# Patient Record
Sex: Female | Born: 1945 | Race: White | Hispanic: No | Marital: Married | Smoking: Never smoker
Health system: Southern US, Community
[De-identification: ages and names within clinical notes are randomized; demographics above are authoritative.]

## PROBLEM LIST (undated history)

## (undated) ENCOUNTER — Emergency Department (HOSPITAL_BASED_OUTPATIENT_CLINIC_OR_DEPARTMENT_OTHER): Admission: EM | Payer: Medicare HMO

## (undated) DIAGNOSIS — I10 Essential (primary) hypertension: Secondary | ICD-10-CM

## (undated) DIAGNOSIS — N318 Other neuromuscular dysfunction of bladder: Secondary | ICD-10-CM

## (undated) DIAGNOSIS — R05 Cough: Secondary | ICD-10-CM

## (undated) DIAGNOSIS — R7302 Impaired glucose tolerance (oral): Secondary | ICD-10-CM

## (undated) DIAGNOSIS — J309 Allergic rhinitis, unspecified: Secondary | ICD-10-CM

## (undated) HISTORY — DX: Cough: R05

## (undated) HISTORY — DX: Other neuromuscular dysfunction of bladder: N31.8

## (undated) HISTORY — DX: Impaired glucose tolerance (oral): R73.02

## (undated) HISTORY — PX: OTHER SURGICAL HISTORY: SHX169

## (undated) HISTORY — DX: Allergic rhinitis, unspecified: J30.9

## (undated) HISTORY — DX: Essential (primary) hypertension: I10

---

## 1999-12-20 ENCOUNTER — Encounter (INDEPENDENT_AMBULATORY_CARE_PROVIDER_SITE_OTHER): Payer: Self-pay

## 1999-12-20 ENCOUNTER — Other Ambulatory Visit: Admission: RE | Admit: 1999-12-20 | Discharge: 1999-12-20 | Payer: Self-pay | Admitting: Obstetrics and Gynecology

## 2000-01-02 ENCOUNTER — Ambulatory Visit (HOSPITAL_COMMUNITY): Admission: RE | Admit: 2000-01-02 | Discharge: 2000-01-02 | Payer: Self-pay | Admitting: Obstetrics and Gynecology

## 2000-01-02 ENCOUNTER — Encounter: Payer: Self-pay | Admitting: Obstetrics and Gynecology

## 2000-08-01 ENCOUNTER — Encounter (INDEPENDENT_AMBULATORY_CARE_PROVIDER_SITE_OTHER): Payer: Self-pay | Admitting: *Deleted

## 2000-08-01 ENCOUNTER — Ambulatory Visit (HOSPITAL_BASED_OUTPATIENT_CLINIC_OR_DEPARTMENT_OTHER): Admission: RE | Admit: 2000-08-01 | Discharge: 2000-08-01 | Payer: Self-pay | Admitting: *Deleted

## 2001-01-26 ENCOUNTER — Other Ambulatory Visit: Admission: RE | Admit: 2001-01-26 | Discharge: 2001-01-26 | Payer: Self-pay | Admitting: Obstetrics and Gynecology

## 2001-02-02 ENCOUNTER — Encounter: Payer: Self-pay | Admitting: Obstetrics and Gynecology

## 2001-02-02 ENCOUNTER — Ambulatory Visit (HOSPITAL_COMMUNITY): Admission: RE | Admit: 2001-02-02 | Discharge: 2001-02-02 | Payer: Self-pay | Admitting: Obstetrics and Gynecology

## 2001-03-09 ENCOUNTER — Ambulatory Visit (HOSPITAL_COMMUNITY): Admission: RE | Admit: 2001-03-09 | Discharge: 2001-03-09 | Payer: Self-pay | Admitting: Cardiology

## 2001-04-10 ENCOUNTER — Other Ambulatory Visit: Admission: RE | Admit: 2001-04-10 | Discharge: 2001-04-10 | Payer: Self-pay | Admitting: Obstetrics and Gynecology

## 2001-04-10 ENCOUNTER — Encounter (INDEPENDENT_AMBULATORY_CARE_PROVIDER_SITE_OTHER): Payer: Self-pay

## 2002-02-01 ENCOUNTER — Other Ambulatory Visit: Admission: RE | Admit: 2002-02-01 | Discharge: 2002-02-01 | Payer: Self-pay | Admitting: Obstetrics and Gynecology

## 2002-02-15 ENCOUNTER — Ambulatory Visit (HOSPITAL_COMMUNITY): Admission: RE | Admit: 2002-02-15 | Discharge: 2002-02-15 | Payer: Self-pay | Admitting: Obstetrics and Gynecology

## 2002-02-15 ENCOUNTER — Encounter: Payer: Self-pay | Admitting: Obstetrics and Gynecology

## 2003-01-31 ENCOUNTER — Other Ambulatory Visit: Admission: RE | Admit: 2003-01-31 | Discharge: 2003-01-31 | Payer: Self-pay | Admitting: Obstetrics and Gynecology

## 2003-02-21 ENCOUNTER — Ambulatory Visit (HOSPITAL_COMMUNITY): Admission: RE | Admit: 2003-02-21 | Discharge: 2003-02-21 | Payer: Self-pay | Admitting: Obstetrics and Gynecology

## 2003-02-21 ENCOUNTER — Encounter: Admission: RE | Admit: 2003-02-21 | Discharge: 2003-02-21 | Payer: Self-pay | Admitting: Obstetrics and Gynecology

## 2003-02-21 ENCOUNTER — Encounter: Payer: Self-pay | Admitting: Obstetrics and Gynecology

## 2003-06-14 ENCOUNTER — Ambulatory Visit (HOSPITAL_COMMUNITY): Admission: RE | Admit: 2003-06-14 | Discharge: 2003-06-14 | Payer: Self-pay | Admitting: Gastroenterology

## 2004-02-13 ENCOUNTER — Other Ambulatory Visit: Admission: RE | Admit: 2004-02-13 | Discharge: 2004-02-13 | Payer: Self-pay | Admitting: Obstetrics and Gynecology

## 2005-03-18 ENCOUNTER — Other Ambulatory Visit: Admission: RE | Admit: 2005-03-18 | Discharge: 2005-03-18 | Payer: Self-pay | Admitting: Obstetrics and Gynecology

## 2005-04-01 ENCOUNTER — Ambulatory Visit (HOSPITAL_COMMUNITY): Admission: RE | Admit: 2005-04-01 | Discharge: 2005-04-01 | Payer: Self-pay | Admitting: Obstetrics and Gynecology

## 2006-07-14 ENCOUNTER — Other Ambulatory Visit: Admission: RE | Admit: 2006-07-14 | Discharge: 2006-07-14 | Payer: Self-pay | Admitting: Obstetrics & Gynecology

## 2006-08-18 ENCOUNTER — Encounter: Admission: RE | Admit: 2006-08-18 | Discharge: 2006-08-18 | Payer: Self-pay | Admitting: Obstetrics and Gynecology

## 2007-09-11 ENCOUNTER — Ambulatory Visit (HOSPITAL_COMMUNITY): Admission: RE | Admit: 2007-09-11 | Discharge: 2007-09-11 | Payer: Self-pay | Admitting: Obstetrics & Gynecology

## 2007-09-17 ENCOUNTER — Other Ambulatory Visit: Admission: RE | Admit: 2007-09-17 | Discharge: 2007-09-17 | Payer: Self-pay | Admitting: Obstetrics and Gynecology

## 2008-10-11 ENCOUNTER — Ambulatory Visit (HOSPITAL_COMMUNITY): Admission: RE | Admit: 2008-10-11 | Discharge: 2008-10-11 | Payer: Self-pay | Admitting: Obstetrics and Gynecology

## 2009-03-28 ENCOUNTER — Ambulatory Visit: Payer: Self-pay | Admitting: Internal Medicine

## 2009-03-28 DIAGNOSIS — I10 Essential (primary) hypertension: Secondary | ICD-10-CM

## 2009-03-28 DIAGNOSIS — N318 Other neuromuscular dysfunction of bladder: Secondary | ICD-10-CM

## 2009-03-28 DIAGNOSIS — R05 Cough: Secondary | ICD-10-CM | POA: Insufficient documentation

## 2009-03-28 DIAGNOSIS — J309 Allergic rhinitis, unspecified: Secondary | ICD-10-CM

## 2009-03-28 DIAGNOSIS — R059 Cough, unspecified: Secondary | ICD-10-CM

## 2009-03-28 HISTORY — DX: Other neuromuscular dysfunction of bladder: N31.8

## 2009-03-28 HISTORY — DX: Essential (primary) hypertension: I10

## 2009-03-28 HISTORY — DX: Allergic rhinitis, unspecified: J30.9

## 2009-03-28 HISTORY — DX: Cough, unspecified: R05.9

## 2009-03-29 LAB — CONVERTED CEMR LAB: Vit D, 25-Hydroxy: 35 ng/mL (ref 30–89)

## 2009-11-14 ENCOUNTER — Ambulatory Visit (HOSPITAL_COMMUNITY): Admission: RE | Admit: 2009-11-14 | Discharge: 2009-11-14 | Payer: Self-pay | Admitting: Obstetrics & Gynecology

## 2009-12-04 ENCOUNTER — Ambulatory Visit: Payer: Self-pay | Admitting: Internal Medicine

## 2009-12-04 LAB — CONVERTED CEMR LAB
Albumin: 3.9 g/dL (ref 3.5–5.2)
Basophils Absolute: 0 10*3/uL (ref 0.0–0.1)
CO2: 29 meq/L (ref 19–32)
Calcium: 9.1 mg/dL (ref 8.4–10.5)
Chloride: 107 meq/L (ref 96–112)
Cholesterol: 199 mg/dL (ref 0–200)
Creatinine, Ser: 0.6 mg/dL (ref 0.4–1.2)
Eosinophils Absolute: 0.2 10*3/uL (ref 0.0–0.7)
HCT: 44.7 % (ref 36.0–46.0)
HDL: 52.8 mg/dL (ref >39.00)
Hemoglobin: 14.9 g/dL (ref 12.0–15.0)
LDL Cholesterol: 122 mg/dL — ABNORMAL HIGH (ref 0–99)
Leukocytes, UA: NEGATIVE
Lymphs Abs: 0.9 10*3/uL (ref 0.7–4.0)
MCHC: 33.4 g/dL (ref 30.0–36.0)
Neutro Abs: 2.8 10*3/uL (ref 1.4–7.7)
Nitrite: NEGATIVE
Platelets: 219 10*3/uL (ref 150.0–400.0)
RDW: 11.1 % — ABNORMAL LOW (ref 11.5–14.6)
Sodium: 141 meq/L (ref 135–145)
Specific Gravity, Urine: 1.025 (ref 1.000–1.030)
TSH: 1.96 microintl units/mL (ref 0.35–5.50)
Total Bilirubin: 1 mg/dL (ref 0.3–1.2)
Triglycerides: 121 mg/dL (ref 0.0–149.0)
Urobilinogen, UA: 0.2 (ref 0.0–1.0)

## 2009-12-08 ENCOUNTER — Ambulatory Visit: Payer: Self-pay | Admitting: Internal Medicine

## 2010-02-23 ENCOUNTER — Ambulatory Visit: Payer: Self-pay | Admitting: Internal Medicine

## 2010-05-30 IMAGING — CR DG CHEST 2V
2 series · 2 of 2 positions shown · non-contrast
Comparison: None

CLINICAL DATA: Cough.

CHEST - 2 VIEW

[view not recorded (1 of 2)]
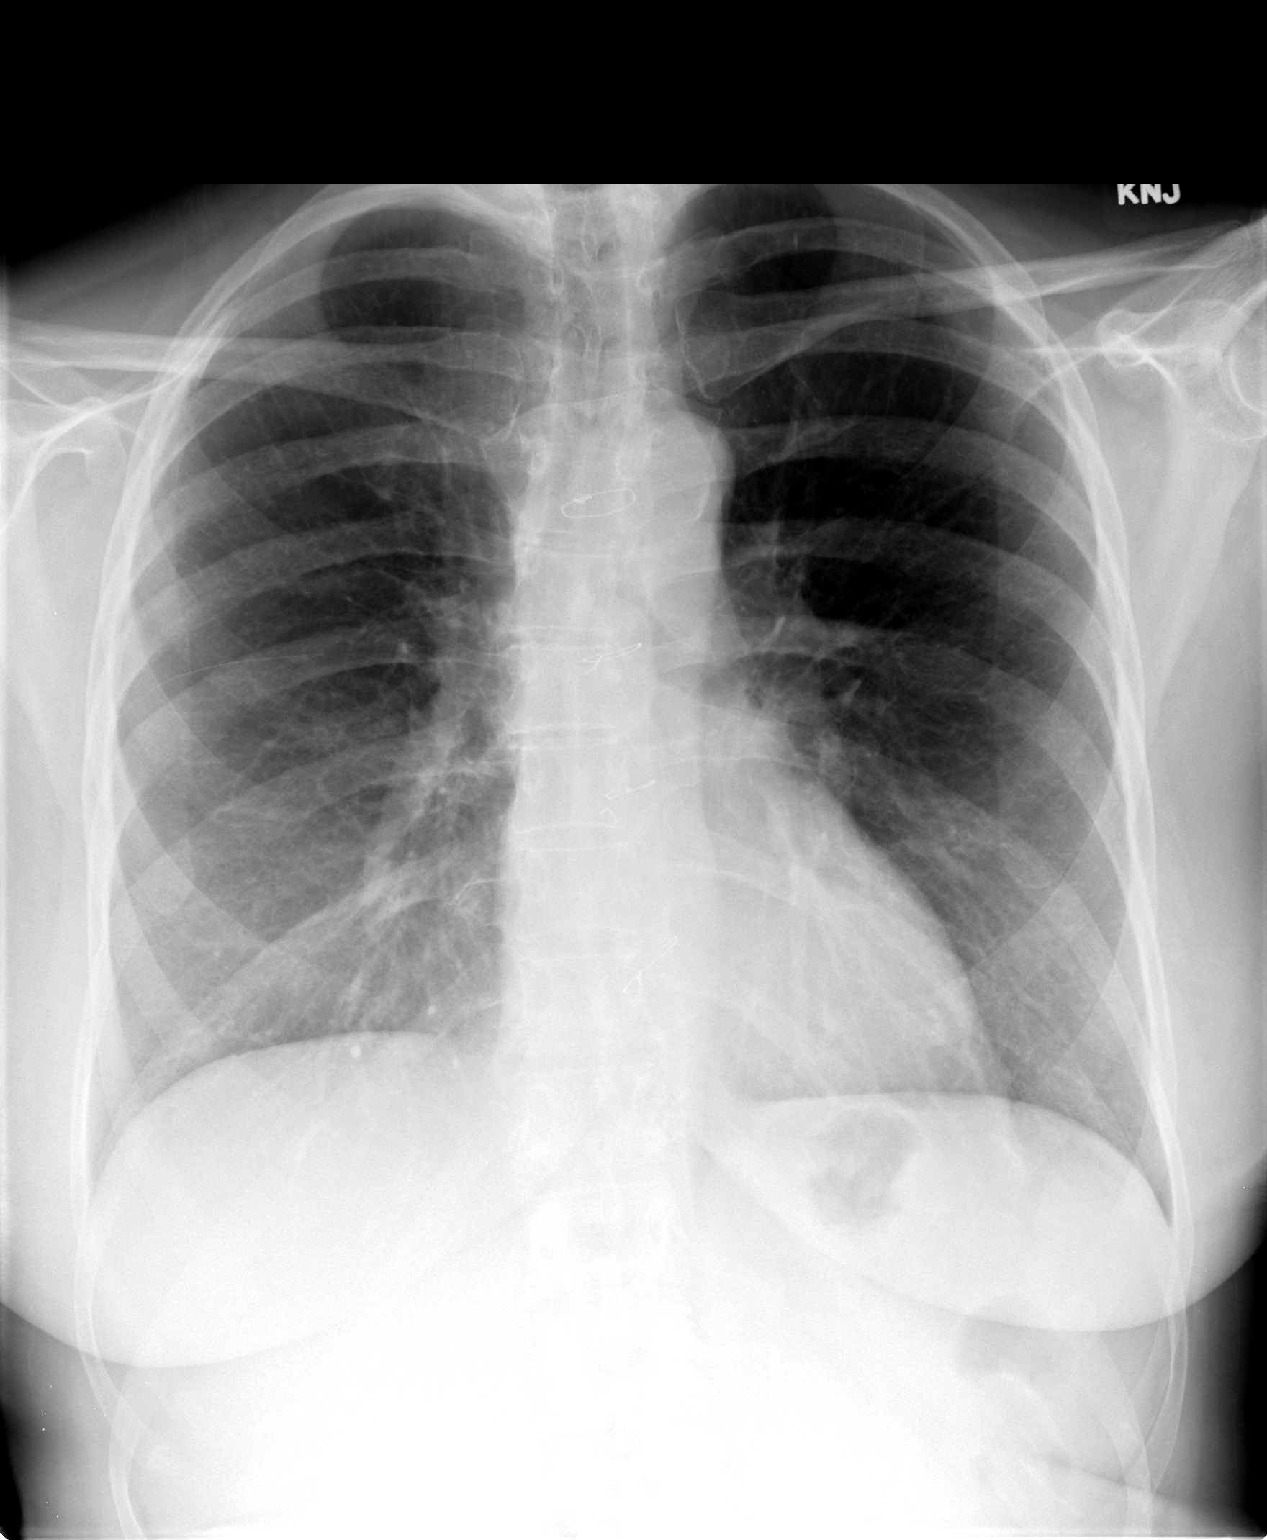

[view not recorded (2 of 2)]
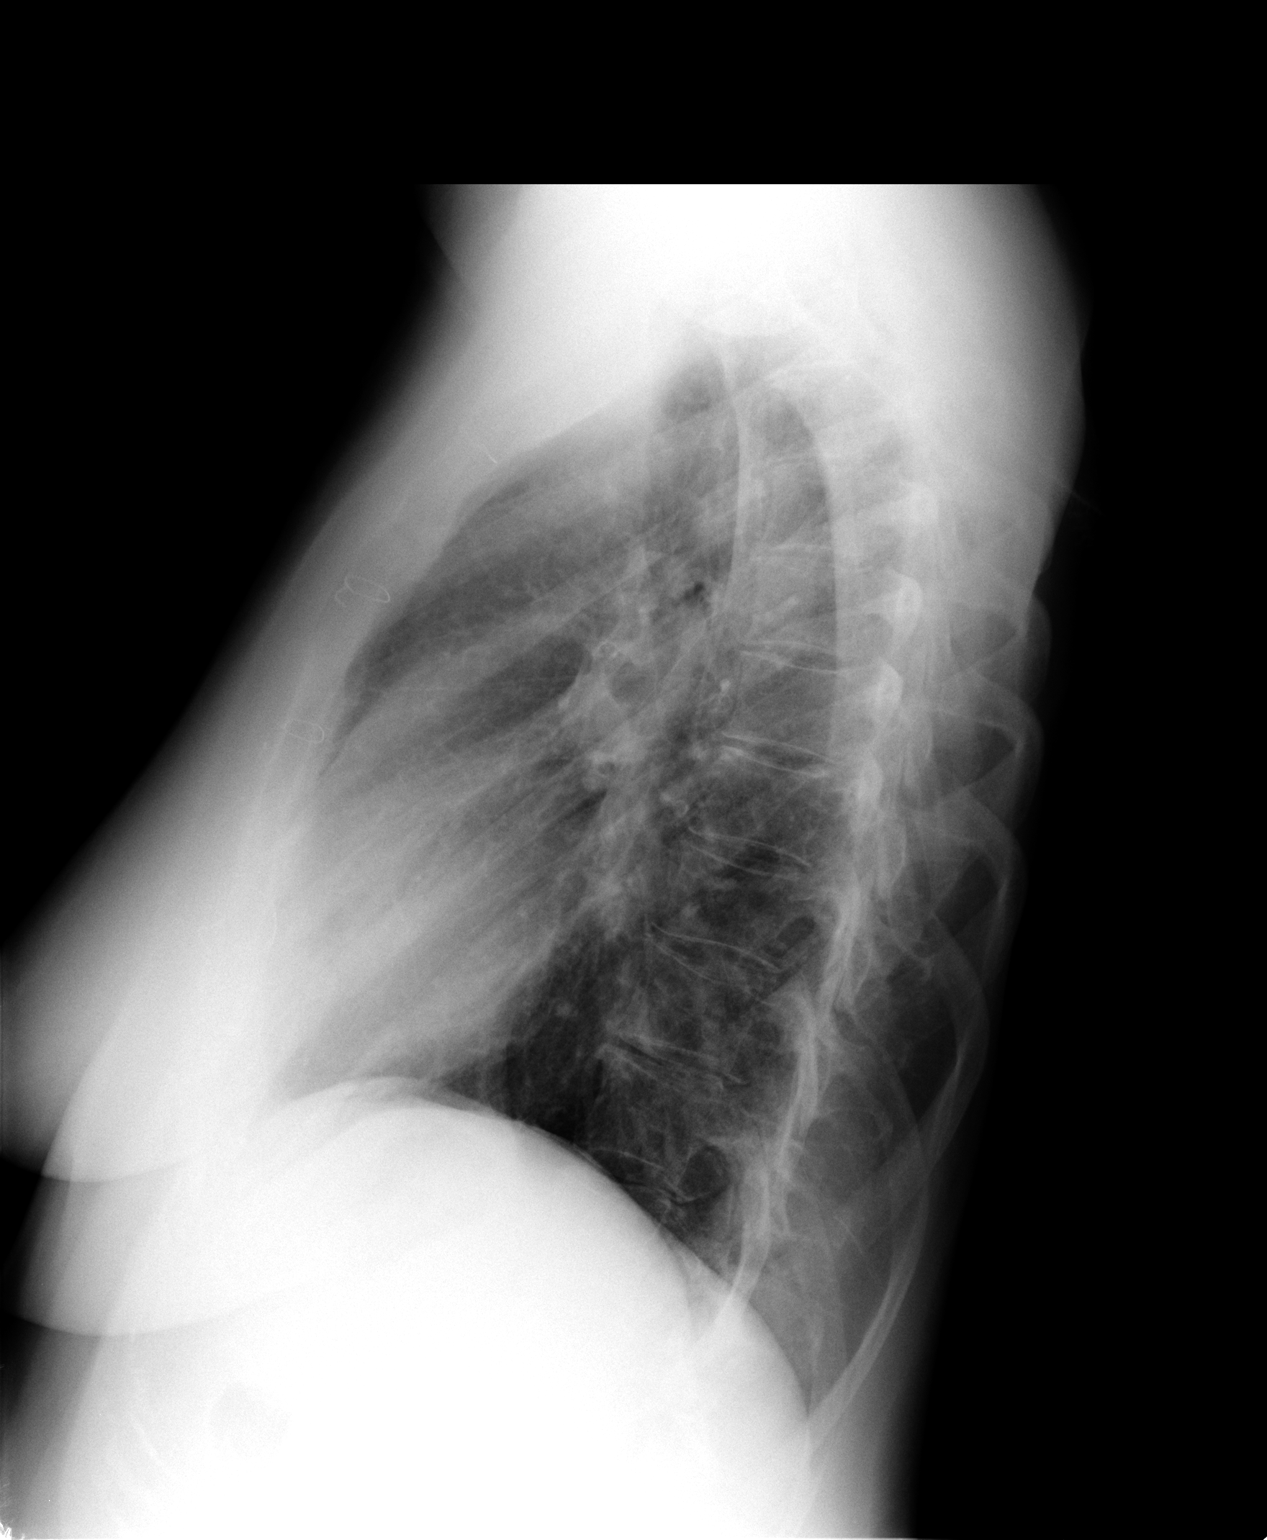

[2 of 2 positions shown; findings below may reference images not displayed]

FINDINGS: The heart size and mediastinal contours are within
normal limits.  Both lungs are clear.  The visualized skeletal
structures are unremarkable.Postop median sternotomy.
IMPRESSION: No active cardiopulmonary disease.

## 2010-11-25 LAB — CONVERTED CEMR LAB
Albumin: 4.1 g/dL (ref 3.5–5.2)
Alkaline Phosphatase: 72 units/L (ref 39–117)
Basophils Absolute: 0 10*3/uL (ref 0.0–0.1)
Basophils Relative: 1.3 % (ref 0.0–3.0)
Bilirubin Urine: NEGATIVE
CO2: 33 meq/L — ABNORMAL HIGH (ref 19–32)
Calcium: 9.1 mg/dL (ref 8.4–10.5)
Chloride: 106 meq/L (ref 96–112)
Cholesterol: 201 mg/dL — ABNORMAL HIGH (ref 0–200)
Direct LDL: 130.1 mg/dL
Eosinophils Absolute: 0.1 10*3/uL (ref 0.0–0.7)
Glucose, Bld: 105 mg/dL — ABNORMAL HIGH (ref 70–99)
HDL: 46.1 mg/dL (ref >39.00)
Hemoglobin: 15.4 g/dL — ABNORMAL HIGH (ref 12.0–15.0)
Ketones, ur: NEGATIVE mg/dL
Lymphocytes Relative: 19 % (ref 12.0–46.0)
Lymphs Abs: 0.7 10*3/uL (ref 0.7–4.0)
MCHC: 34.8 g/dL (ref 30.0–36.0)
MCV: 92.3 fL (ref 78.0–100.0)
Monocytes Absolute: 0.4 10*3/uL (ref 0.1–1.0)
Neutro Abs: 2.6 10*3/uL (ref 1.4–7.7)
Nitrite: NEGATIVE
RBC: 4.8 M/uL (ref 3.87–5.11)
RDW: 12 % (ref 11.5–14.6)
Sodium: 142 meq/L (ref 135–145)
Total Protein, Urine: NEGATIVE mg/dL
Total Protein: 6.8 g/dL (ref 6.0–8.3)
Triglycerides: 135 mg/dL (ref 0.0–149.0)
Urine Glucose: NEGATIVE mg/dL
VLDL: 27 mg/dL (ref 0.0–40.0)
pH: 6 (ref 5.0–8.0)

## 2010-11-27 NOTE — Assessment & Plan Note (Signed)
Summary: CPX-LB   Vital Signs:  Patient profile:   65 year old female Height:      68 inches Weight:      174.75 pounds BMI:     26.67 O2 Sat:      97 % on Room air Temp:     98.1 degrees F oral Pulse rate:   60 / minute BP sitting:   114 / 72  (left arm) Cuff size:   regular  Vitals Entered ByZella Ball Ewing (December 08, 2009 1:27 PM)  O2 Flow:  Room air  Preventive Care Screening  Mammogram:    Date:  10/28/2009    Results:  normal      s/p flu shot oct 2010 at [facility]  CC: Adult Physical/RE   CC:  Adult Physical/RE.  History of Present Illness: here with perennial nasal /sinus symtpoms  for most of the last 9 months since got the new dog;  OTC meds no help such a claritin, but husband nasal spray seemed to help; Pt denies CP, sob, doe, wheezing, orthopnea, pnd, worsening LE edema, palps, dizziness or syncope  Pt denies new neuro symptoms such as headache, facial or extremity weakness   Problems Prior to Update: 1)  Overactive Bladder  (ICD-596.51) 2)  Allergic Rhinitis  (ICD-477.9) 3)  Cough  (ICD-786.2) 4)  Preventive Health Care  (ICD-V70.0) 5)  Hypertension  (ICD-401.9)  Medications Prior to Update: 1)  Benazepril-Hydrochlorothiazide 20-12.5 Mg Tabs (Benazepril-Hydrochlorothiazide) .Marland Kitchen.. 1 By Mouth Once Daily 2)  Asprin 81mg  .... 1 By Mouth Once Daily  Current Medications (verified): 1)  Benazepril-Hydrochlorothiazide 20-12.5 Mg Tabs (Benazepril-Hydrochlorothiazide) .Marland Kitchen.. 1 By Mouth Once Daily 2)  Asprin 81mg  .... 1 By Mouth Once Daily 3)  Nasacort Aq 55 Mcg/act Aers (Triamcinolone Acetonide(Nasal)) .... 2 Spray/side Once Daily  Allergies (verified): No Known Drug Allergies  Past History:  Past Medical History: Last updated: 03/28/2009 Hypertension Allergic rhinitis  Past Surgical History: Last updated: 03/28/2009 s/p breast biopsy x 2 1970's - benign  Family History: Last updated: 03/28/2009 mother with RA, HTN  father with heart disease, CABg,  defibrillator, DM  Social History: Last updated: 03/28/2009 Married for second time 2 adult biological children work - part time- care giver for disabed Never Smoked Alcohol use-yes - social  Risk Factors: Smoking Status: never (03/28/2009)  Review of Systems  The patient denies anorexia, fever, weight loss, weight gain, vision loss, decreased hearing, hoarseness, chest pain, syncope, dyspnea on exertion, peripheral edema, prolonged cough, headaches, hemoptysis, abdominal pain, melena, hematochezia, severe indigestion/heartburn, hematuria, incontinence, muscle weakness, suspicious skin lesions, transient blindness, difficulty walking, depression, unusual weight change, abnormal bleeding, enlarged lymph nodes, and angioedema.         all otherwise negative per pt -  Physical Exam  General:  alert and well-developed.   Head:  normocephalic and atraumatic.   Eyes:  vision grossly intact, pupils equal, and pupils round.   Ears:  R ear normal and L ear normal.   Nose:  nasal dischargemucosal pallor and mucosal erythema.   Mouth:  no gingival abnormalities and pharynx pink and moist.   Neck:  supple and no masses.   Lungs:  normal respiratory effort and normal breath sounds.   Heart:  normal rate and regular rhythm.   Abdomen:  soft, non-tender, and normal bowel sounds.   Msk:  no joint tenderness and no joint swelling.   Extremities:  no edema, no erythema  Neurologic:  cranial nerves II-XII intact and strength normal in all  extremities.     Impression & Recommendations:  Problem # 1:  Preventive Health Care (ICD-V70.0)  Overall doing well, age appropriate education and counseling updated and referral for appropriate preventive services done unless declined, immunizations up to date or declined, diet counseling done if overweight, urged to quit smoking if smokes , most recent labs reviewed and current ordered if appropriate, ecg reviewed or declined (interpretation per ECG scanned  in the EMR if done); information regarding Medicare Prevention requirements given if appropriate   Orders: EKG w/ Interpretation (93000)  Problem # 2:  ALLERGIC RHINITIS (ICD-477.9)  Her updated medication list for this problem includes:    Nasacort Aq 55 Mcg/act Aers (Triamcinolone acetonide(nasal)) .Marland Kitchen... 2 spray/side once daily treat as above, f/u any worsening signs or symptoms   Complete Medication List: 1)  Benazepril-hydrochlorothiazide 20-12.5 Mg Tabs (Benazepril-hydrochlorothiazide) .Marland Kitchen.. 1 by mouth once daily 2)  Asprin 81mg   .... 1 by mouth once daily 3)  Nasacort Aq 55 Mcg/act Aers (Triamcinolone acetonide(nasal)) .... 2 spray/side once daily  Patient Instructions: 1)  please call for your yearly pap smear 2)  please call your insurance to ask if the shingles shot is covered 3)  if it is covered, call for a Nurse visit for the shot 4)  Please take all new medications as prescribed 5)  Continue all previous medications as before this visit  6)  Please schedule a follow-up appointment in 1 year or sooner if needed Prescriptions: BENAZEPRIL-HYDROCHLOROTHIAZIDE 20-12.5 MG TABS (BENAZEPRIL-HYDROCHLOROTHIAZIDE) 1 by mouth once daily  #90 x 3   Entered and Authorized by:   Corwin Levins MD   Signed by:   Corwin Levins MD on 12/08/2009   Method used:   Print then Give to Patient   RxID:   [id number] NASACORT AQ 55 MCG/ACT AERS (TRIAMCINOLONE ACETONIDE(NASAL)) 2 spray/side once daily  #3 x 3   Entered and Authorized by:   Corwin Levins MD   Signed by:   Corwin Levins MD on 12/08/2009   Method used:   Print then Give to Patient   RxID:   [id number]    Immunization History:  Influenza Immunization History:    Influenza:  historical (07/28/2009)    Immunization History:  Influenza Immunization History:    Influenza:  Historical (07/28/2009)

## 2010-11-27 NOTE — Assessment & Plan Note (Signed)
Summary: ZOSTAVAX--JWJ--PER PT [employer] PAY 100%-SAYS SHE CHECK'D W/HER INS...  Nurse Visit   Allergies: No Known Drug Allergies  Immunizations Administered:  Zostavax # 1:    Vaccine Type: Zostavax    Site: left deltoid    Mfr: Merck    Dose: 0.5 ml    Route: [place]    Given by: Margaret Pyle, CMA    Exp. Date: 03/04/2011    Lot #: [id number]  Orders Added: 1)  Zoster (Shingles) Vaccine Live [90736] 2)  Admin 1st Vaccine [13244]

## 2010-11-27 NOTE — Letter (Signed)
Summary: Wellness Screening Form/[facility]  Wellness Screening Form/[facility]   Imported By: Sherian Rein 02/27/2010 13:58:45  _____________________________________________________________________  External Attachment:    Type:   Image     Comment:   External Document

## 2010-12-06 ENCOUNTER — Other Ambulatory Visit: Payer: Self-pay

## 2010-12-06 DIAGNOSIS — Z1231 Encounter for screening mammogram for malignant neoplasm of breast: Secondary | ICD-10-CM

## 2010-12-18 ENCOUNTER — Ambulatory Visit (HOSPITAL_COMMUNITY)
Admission: RE | Admit: 2010-12-18 | Discharge: 2010-12-18 | Disposition: A | Discharge status: Home / Self Care | Payer: PRIVATE HEALTH INSURANCE | Source: Ambulatory Visit | Attending: Internal Medicine | Admitting: Internal Medicine

## 2010-12-18 DIAGNOSIS — Z1231 Encounter for screening mammogram for malignant neoplasm of breast: Secondary | ICD-10-CM | POA: Insufficient documentation

## 2011-02-26 ENCOUNTER — Other Ambulatory Visit: Payer: Self-pay | Admitting: Internal Medicine

## 2011-03-04 ENCOUNTER — Other Ambulatory Visit: Payer: Self-pay

## 2011-03-04 MED ORDER — BENAZEPRIL-HYDROCHLOROTHIAZIDE 20-12.5 MG PO TABS: 1.0000 | ORAL_TABLET | Freq: Every day | ORAL | Status: DC

## 2011-03-04 NOTE — Telephone Encounter (Signed)
Pt has CPX scheduled 04/12/2011. Pt requested we change Rx to 90 day supply because it is more expensive for 30 day.

## 2011-03-15 NOTE — Op Note (Signed)
   NAME:  Annette Schmitt, Annette Schmitt                         ACCOUNT NO.:  [id number]   MEDICAL RECORD NO.:  [id number]                   PATIENT TYPE:  AMB   LOCATION:  ENDO                                 FACILITY:  [facility]   PHYSICIAN:  Graylin Shiver, M.D.                DATE OF BIRTH:  [birth year]   DATE OF PROCEDURE:  06/14/2003  DATE OF DISCHARGE:                                 OPERATIVE REPORT   PROCEDURE:  Colonoscopy.   INDICATION FOR PROCEDURE:  Screening.   Informed consent was obtained after explanation of the risks of bleeding,  infection, and perforation.   PREMEDICATION:  Fentanyl 60 mcg IV, Versed 6 mg IV.   DESCRIPTION OF PROCEDURE:  With the patient in the left lateral decubitus  position, a rectal exam was performed and no masses were felt.  The Olympus  colonoscope was inserted into the rectum and advanced around the colon to  the cecum.  Cecal landmarks were identified.  The cecum and ascending colon  were normal.  The transverse colon was normal.  The descending colon,  sigmoid, and rectum were normal.  She tolerated the procedure well without  complications.   IMPRESSION:  Normal colonoscopy to the cecum.                                               Graylin Shiver, M.D.    Annette Schmitt  D:  06/14/2003  T:  06/14/2003  Job:  [id number]   cc:   Vikki Ports, M.D.  [address]  [place]  [place] [address]  Fax: [phone]

## 2011-03-15 NOTE — Op Note (Signed)
. [facility]  Patient:    Annette Schmitt, Annette Schmitt                      MRN: [id number] Proc. Date: 08/01/00 Adm. Date:  [id number] Attending:  Stephenie Acres                           Operative Report  PREOPERATIVE DIAGNOSIS:  Right shoulder mass.  POSTOPERATIVE DIAGNOSIS:  Right shoulder mass.  OPERATION PERFORMED:  Excision of right shoulder soft tissue mass.  SURGEON:  Stephenie Acres, M.D.  ANESTHESIA:  Local MAC.  DESCRIPTION OF PROCEDURE:  The patient was taken to the operating room and placed in supine position.  After adequate MAC anesthesia was induced,  The right shoulder was prepped and draped in normal sterile fashion.  Using 1% lidocaine local anesthesia, the skin and subcutaneous tissue were anesthetized.  A vertically oriented incision was made over the mass and dissected down through subcutaneous tissues onto a soft, fatty mass.  This was enucleated easily and delivered through the wound.  Adequate hemostasis was ensured and the skin was closed with subcuticular 4-0 Monocryl.  Steri-Strips and sterile dressing was applied.  The patient tolerated the procedure well and went to PACU in good condition. DD:  08/01/00 TD:  08/02/00 Job: [id number] LKG/[id number]

## 2011-04-05 ENCOUNTER — Other Ambulatory Visit (INDEPENDENT_AMBULATORY_CARE_PROVIDER_SITE_OTHER): Payer: PRIVATE HEALTH INSURANCE

## 2011-04-05 ENCOUNTER — Other Ambulatory Visit (INDEPENDENT_AMBULATORY_CARE_PROVIDER_SITE_OTHER): Payer: PRIVATE HEALTH INSURANCE | Admitting: Internal Medicine

## 2011-04-05 ENCOUNTER — Other Ambulatory Visit: Payer: Self-pay | Admitting: Internal Medicine

## 2011-04-05 DIAGNOSIS — Z0000 Encounter for general adult medical examination without abnormal findings: Secondary | ICD-10-CM

## 2011-04-05 LAB — CBC WITH DIFFERENTIAL/PLATELET
Basophils Absolute: 0.1 10*3/uL (ref 0.0–0.1)
Basophils Relative: 1.2 % (ref 0.0–3.0)
Eosinophils Absolute: 0.2 10*3/uL (ref 0.0–0.7)
Lymphocytes Relative: 21.5 % (ref 12.0–46.0)
MCHC: 34.6 g/dL (ref 30.0–36.0)
Neutrophils Relative %: 63.2 % (ref 43.0–77.0)
RBC: 4.9 Mil/uL (ref 3.87–5.11)
RDW: 12 % (ref 11.5–14.6)

## 2011-04-05 LAB — HEPATIC FUNCTION PANEL
Albumin: 4.1 g/dL (ref 3.5–5.2)
Alkaline Phosphatase: 67 U/L (ref 39–117)
Total Protein: 6.8 g/dL (ref 6.0–8.3)

## 2011-04-05 LAB — URINALYSIS, ROUTINE W REFLEX MICROSCOPIC
Nitrite: NEGATIVE
Specific Gravity, Urine: 1.02 (ref 1.000–1.030)
Urobilinogen, UA: 0.2 (ref 0.0–1.0)

## 2011-04-05 LAB — LIPID PANEL
Cholesterol: 210 mg/dL — ABNORMAL HIGH (ref 0–200)
HDL: 44.6 mg/dL (ref >39.00)
Triglycerides: 155 mg/dL — ABNORMAL HIGH (ref 0.0–149.0)
VLDL: 31 mg/dL (ref 0.0–40.0)

## 2011-04-05 LAB — BASIC METABOLIC PANEL
BUN: 20 mg/dL (ref 6–23)
GFR: 113.08 mL/min (ref >60.00)
Potassium: 4.2 mEq/L (ref 3.5–5.1)
Sodium: 141 mEq/L (ref 135–145)

## 2011-04-07 ENCOUNTER — Encounter: Payer: Self-pay | Admitting: Internal Medicine

## 2011-04-07 DIAGNOSIS — Z0000 Encounter for general adult medical examination without abnormal findings: Secondary | ICD-10-CM | POA: Insufficient documentation

## 2011-04-12 ENCOUNTER — Ambulatory Visit (INDEPENDENT_AMBULATORY_CARE_PROVIDER_SITE_OTHER)
Admission: RE | Admit: 2011-04-12 | Discharge: 2011-04-12 | Disposition: A | Discharge status: Home / Self Care | Payer: Medicare HMO | Source: Ambulatory Visit | Attending: Internal Medicine | Admitting: Internal Medicine

## 2011-04-12 ENCOUNTER — Encounter: Payer: Self-pay | Admitting: Internal Medicine

## 2011-04-12 ENCOUNTER — Ambulatory Visit (INDEPENDENT_AMBULATORY_CARE_PROVIDER_SITE_OTHER): Payer: Medicare HMO | Admitting: Internal Medicine

## 2011-04-12 VITALS — BP 118/70 | HR 73 | Temp 97.7°F | Ht 68.0 in | Wt 179.5 lb

## 2011-04-12 DIAGNOSIS — I10 Essential (primary) hypertension: Secondary | ICD-10-CM

## 2011-04-12 DIAGNOSIS — Z0000 Encounter for general adult medical examination without abnormal findings: Secondary | ICD-10-CM

## 2011-04-12 DIAGNOSIS — R059 Cough, unspecified: Secondary | ICD-10-CM

## 2011-04-12 DIAGNOSIS — R05 Cough: Secondary | ICD-10-CM

## 2011-04-12 DIAGNOSIS — R21 Rash and other nonspecific skin eruption: Secondary | ICD-10-CM

## 2011-04-12 DIAGNOSIS — J309 Allergic rhinitis, unspecified: Secondary | ICD-10-CM

## 2011-04-12 MED ORDER — LOSARTAN POTASSIUM-HCTZ 50-12.5 MG PO TABS: 1.0000 | ORAL_TABLET | Freq: Every day | ORAL | Status: DC

## 2011-04-12 MED ORDER — PREDNISONE 10 MG PO TABS
10.0000 mg | ORAL_TABLET | Freq: Every day | ORAL | Status: AC
Start: 2011-04-12 — End: 2011-04-22

## 2011-04-12 NOTE — Assessment & Plan Note (Signed)
prob multifactorial, for cxr , but suspect ace related +/- allergies ; to d/c the ace

## 2011-04-12 NOTE — Assessment & Plan Note (Signed)

## 2011-04-12 NOTE — Assessment & Plan Note (Signed)
stable overall by hx and exam, most recent data reviewed with pt, and pt to continue medical treatment as before  but to change to hyzaar to avoid cough;  F/u BP at home and next visit

## 2011-04-12 NOTE — Assessment & Plan Note (Signed)
For trial prednisone for flare symptoms  - to allergist if not improved

## 2011-04-12 NOTE — Patient Instructions (Addendum)
Take all new medications as prescribed - the new blood pressure medicine, and the short course prednisone Ok to stop the benazepril-HCT due to the cough Please go to XRAY in the Basement for the x-ray test Please call the phone number [phone] (the [employer] System) for results of testing in 2-3 days;  When calling, simply dial the number, and when prompted enter the MRN number above (the Medical Record Number) and the # key, then the message should start. Please call if you would like referral to allergist You will be contacted regarding the referral for: Dermatology Please return in 1 year for your yearly visit, or sooner if needed, with Lab testing done 3-5 days before Please remember to check your BP on a regular basis;  Your goal is to be < 140/90

## 2011-04-12 NOTE — Assessment & Plan Note (Signed)
With several white ? Comedone like lesion to nasal area - for derm referral

## 2011-04-21 ENCOUNTER — Encounter: Payer: Self-pay | Admitting: Internal Medicine

## 2011-04-21 NOTE — Progress Notes (Signed)
Subjective:    Patient ID: Annette Schmitt, female    DOB: [birth year], 65 y.o.   MRN: [id number]  HPI  Here for wellness and f/u;  Overall doing ok;  Pt denies CP, worsening SOB, DOE, wheezing, orthopnea, PND, worsening LE edema, palpitations, dizziness or syncope.  Pt denies neurological change such as new Headache, facial or extremity weakness.  Pt denies polydipsia, polyuria, or low sugar symptoms. Pt states overall good compliance with treatment and medications, good tolerability, and trying to follow lower cholesterol diet.  Pt denies worsening depressive symptoms, suicidal ideation or panic. No fever, wt loss, night sweats, loss of appetite, or other constitutional symptoms.  Pt states good ability with ADL's, low fall risk, home safety reviewed and adequate, no significant changes in hearing or vision, and occasionally active with exercise.  Unfortunately has had nonprod cough ongoing mild since about the time of starting the ACEI,  But also with Does have several wks ongoing nasal allergy symptoms with clear congestion, itch and sneeze, without fever, pain, ST,  or wheezing. Has rash as well to face and reqeusts derm referral Past Medical History  Diagnosis Date  . ALLERGIC RHINITIS 03/28/2009  . Cough 03/28/2009  . HYPERTENSION 03/28/2009  . OVERACTIVE BLADDER 03/28/2009   Past Surgical History  Procedure Date  . S/p breast biopsy      x2 1970's benign    reports that she has never smoked. She does not have any smokeless tobacco history on file. She reports that she drinks alcohol. She reports that she does not use illicit drugs. family history includes Arthritis in her mother; Diabetes in her father; Heart disease in her father; and Hypertension in her mother. No Known Allergies Current Outpatient Prescriptions on File Prior to Visit  Medication Sig Dispense Refill  . aspirin 81 MG EC tablet Take 81 mg by mouth daily.         Review of Systems Review of Systems  Constitutional: Negative  for diaphoresis, activity change, appetite change and unexpected weight change.  HENT: Negative for hearing loss, ear pain, facial swelling, mouth sores and neck stiffness.   Eyes: Negative for pain, redness and visual disturbance.  Respiratory: Negative for shortness of breath and wheezing.   Cardiovascular: Negative for chest pain and palpitations.  Gastrointestinal: Negative for diarrhea, blood in stool, abdominal distention and rectal pain.  Genitourinary: Negative for hematuria, flank pain and decreased urine volume.  Musculoskeletal: Negative for myalgias and joint swelling.  Skin: Negative for color change and wound.  Neurological: Negative for syncope and numbness.  Hematological: Negative for adenopathy.  Psychiatric/Behavioral: Negative for hallucinations, self-injury, decreased concentration and agitation.      Objective:   Physical Exam BP 118/70  Pulse 73  Temp(Src) 97.7 F (36.5 C) (Oral)  Ht 5\' 8"  (1.727 m)  Wt 179 lb 8 oz (81.421 kg)  BMI 27.29 kg/m2  SpO2 95% Physical Exam  VS noted Constitutional: Pt is oriented to person, place, and time. Appears well-developed and well-nourished.  HENT:  Head: Normocephalic and atraumatic.  Right Ear: External ear normal.  Left Ear: External ear normal.  Nose: Nose normal.  Mouth/Throat: Oropharynx is clear and moist.  Eyes: Conjunctivae and EOM are normal. Pupils are equal, round, and reactive to light.  Neck: Normal range of motion. Neck supple. No JVD present. No tracheal deviation present.  Cardiovascular: Normal rate, regular rhythm, normal heart sounds and intact distal pulses.   Pulmonary/Chest: Effort normal and breath sounds normal.  Abdominal: Soft. Bowel  sounds are normal. There is no tenderness.  Musculoskeletal: Normal range of motion. Exhibits no edema.  Lymphadenopathy:  Has no cervical adenopathy.  Neurological: Pt is alert and oriented to person, place, and time. Pt has normal reflexes. No cranial nerve  deficit.  Skin: Skin is warm and dry. No rash noted. except several white raised ? Comedones near nasal area Psychiatric:  Has  normal mood and affect. Behavior is normal.         Assessment & Plan:

## 2012-01-22 ENCOUNTER — Telehealth: Payer: Self-pay

## 2012-01-22 DIAGNOSIS — Z0000 Encounter for general adult medical examination without abnormal findings: Secondary | ICD-10-CM

## 2012-01-22 NOTE — Telephone Encounter (Signed)
Put order in for physical labs. 

## 2012-03-30 ENCOUNTER — Other Ambulatory Visit: Payer: Self-pay | Admitting: Internal Medicine

## 2012-04-10 ENCOUNTER — Other Ambulatory Visit (INDEPENDENT_AMBULATORY_CARE_PROVIDER_SITE_OTHER): Payer: Medicare HMO

## 2012-04-10 DIAGNOSIS — Z0000 Encounter for general adult medical examination without abnormal findings: Secondary | ICD-10-CM

## 2012-04-10 DIAGNOSIS — I10 Essential (primary) hypertension: Secondary | ICD-10-CM

## 2012-04-10 LAB — CBC WITH DIFFERENTIAL/PLATELET
Basophils Relative: 0.9 % (ref 0.0–3.0)
Eosinophils Absolute: 0.2 10*3/uL (ref 0.0–0.7)
Lymphocytes Relative: 19.2 % (ref 12.0–46.0)
MCHC: 33.3 g/dL (ref 30.0–36.0)
Neutrophils Relative %: 66.4 % (ref 43.0–77.0)
RBC: 4.99 Mil/uL (ref 3.87–5.11)
WBC: 5.2 10*3/uL (ref 4.5–10.5)

## 2012-04-10 LAB — BASIC METABOLIC PANEL
BUN: 17 mg/dL (ref 6–23)
CO2: 26 mEq/L (ref 19–32)
Chloride: 108 mEq/L (ref 96–112)
Creatinine, Ser: 0.7 mg/dL (ref 0.4–1.2)
Glucose, Bld: 102 mg/dL — ABNORMAL HIGH (ref 70–99)

## 2012-04-10 LAB — URINALYSIS, ROUTINE W REFLEX MICROSCOPIC
Leukocytes, UA: NEGATIVE
Specific Gravity, Urine: 1.025 (ref 1.000–1.030)
Urobilinogen, UA: 0.2 (ref 0.0–1.0)

## 2012-04-10 LAB — LIPID PANEL
Cholesterol: 200 mg/dL (ref 0–200)
LDL Cholesterol: 122 mg/dL — ABNORMAL HIGH (ref 0–99)
Total CHOL/HDL Ratio: 4

## 2012-04-10 LAB — HEPATIC FUNCTION PANEL
Alkaline Phosphatase: 58 U/L (ref 39–117)
Bilirubin, Direct: 0.1 mg/dL (ref 0.0–0.3)
Total Bilirubin: 0.9 mg/dL (ref 0.3–1.2)
Total Protein: 6.6 g/dL (ref 6.0–8.3)

## 2012-04-13 ENCOUNTER — Ambulatory Visit (INDEPENDENT_AMBULATORY_CARE_PROVIDER_SITE_OTHER): Payer: Medicare HMO | Admitting: Internal Medicine

## 2012-04-13 ENCOUNTER — Encounter: Payer: Self-pay | Admitting: Internal Medicine

## 2012-04-13 VITALS — BP 130/82 | HR 61 | Temp 97.4°F | Ht 68.0 in | Wt 178.5 lb

## 2012-04-13 DIAGNOSIS — L989 Disorder of the skin and subcutaneous tissue, unspecified: Secondary | ICD-10-CM | POA: Insufficient documentation

## 2012-04-13 DIAGNOSIS — Z0000 Encounter for general adult medical examination without abnormal findings: Secondary | ICD-10-CM

## 2012-04-13 DIAGNOSIS — C4491 Basal cell carcinoma of skin, unspecified: Secondary | ICD-10-CM | POA: Insufficient documentation

## 2012-04-13 MED ORDER — LOSARTAN POTASSIUM-HCTZ 50-12.5 MG PO TABS: 1.0000 | ORAL_TABLET | Freq: Every day | ORAL | Status: DC

## 2012-04-13 NOTE — Assessment & Plan Note (Signed)

## 2012-04-13 NOTE — Patient Instructions (Addendum)
Continue all other medications as before You will be contacted regarding the referral for: dermatology Please continue your efforts at being more active, low cholesterol diet, and weight control. Please return in 1 year for your yearly visit, or sooner if needed, with Lab testing done 3-5 days before

## 2012-04-19 ENCOUNTER — Encounter: Payer: Self-pay | Admitting: Internal Medicine

## 2012-04-19 NOTE — Progress Notes (Signed)
Subjective:    Patient ID: Annette Schmitt, female    DOB: 16-Dec-1945, 66 y.o.   MRN: [id number]  HPI  Here for wellness and f/u;  Overall doing ok;  Pt denies CP, worsening SOB, DOE, wheezing, orthopnea, PND, worsening LE edema, palpitations, dizziness or syncope.  Pt denies neurological change such as new Headache, facial or extremity weakness.  Pt denies polydipsia, polyuria, or low sugar symptoms. Pt states overall good compliance with treatment and medications, good tolerability, and trying to follow lower cholesterol diet.  Pt denies worsening depressive symptoms, suicidal ideation or panic. No fever, wt loss, night sweats, loss of appetite, or other constitutional symptoms.  Pt states good ability with ADL's, low fall risk, home safety reviewed and adequate, no significant changes in hearing or vision, and occasionally active with exercise.  No acute complaints.  Does have a skin lesion to the chest that has been increased in size in past few months Past Medical History  Diagnosis Date  . ALLERGIC RHINITIS 03/28/2009  . Cough 03/28/2009  . HYPERTENSION 03/28/2009  . OVERACTIVE BLADDER 03/28/2009   Past Surgical History  Procedure Date  . S/p breast biopsy      x2 1970's benign    reports that she has never smoked. She does not have any smokeless tobacco history on file. She reports that she drinks alcohol. She reports that she does not use illicit drugs. family history includes Arthritis in her mother; Diabetes in her father; Heart disease in her father; and Hypertension in her mother. No Known Allergies Current Outpatient Prescriptions on File Prior to Visit  Medication Sig Dispense Refill  . aspirin 81 MG EC tablet Take 81 mg by mouth daily.        Marland Kitchen losartan-hydrochlorothiazide (HYZAAR) 50-12.5 MG per tablet Take 1 tablet by mouth daily.  90 tablet  3   Review of Systems Review of Systems  Constitutional: Negative for diaphoresis, activity change, appetite change and unexpected weight  change.  HENT: Negative for hearing loss, ear pain, facial swelling, mouth sores and neck stiffness.   Eyes: Negative for pain, redness and visual disturbance.  Respiratory: Negative for shortness of breath and wheezing.   Cardiovascular: Negative for chest pain and palpitations.  Gastrointestinal: Negative for diarrhea, blood in stool, abdominal distention and rectal pain.  Genitourinary: Negative for hematuria, flank pain and decreased urine volume.  Musculoskeletal: Negative for myalgias and joint swelling.  Skin: Negative for color change and wound.  Neurological: Negative for syncope and numbness.  Hematological: Negative for adenopathy.  Psychiatric/Behavioral: Negative for hallucinations, self-injury, decreased concentration and agitation.      Objective:   Physical Exam BP 130/82  Pulse 61  Temp 97.4 F (36.3 C) (Oral)  Ht 5\' 8"  (1.727 m)  Wt 178 lb 8 oz (80.967 kg)  BMI 27.14 kg/m2  SpO2 96% Physical Exam  VS noted Constitutional: Pt is oriented to person, place, and time. Appears well-developed and well-nourished.  HENT:  Head: Normocephalic and atraumatic.  Right Ear: External ear normal.  Left Ear: External ear normal.  Nose: Nose normal.  Mouth/Throat: Oropharynx is clear and moist.  Eyes: Conjunctivae and EOM are normal. Pupils are equal, round, and reactive to light.  Neck: Normal range of motion. Neck supple. No JVD present. No tracheal deviation present.  Cardiovascular: Normal rate, regular rhythm, normal heart sounds and intact distal pulses.   Pulmonary/Chest: Effort normal and breath sounds normal.  Abdominal: Soft. Bowel sounds are normal. There is no tenderness.  Musculoskeletal:  Normal range of motion. Exhibits no edema.  Lymphadenopathy:  Has no cervical adenopathy.  Neurological: Pt is alert and oriented to person, place, and time. Pt has normal reflexes. No cranial nerve deficit.  Skin: Skin is warm and dry. No rash noted. 1/4 cm nonraised lesion  right upper chest noted, tan/gray Psychiatric:  Has  normal mood and affect. Behavior is normal.     Assessment & Plan:

## 2012-04-19 NOTE — Assessment & Plan Note (Signed)
For derm referral 

## 2012-04-24 ENCOUNTER — Other Ambulatory Visit: Payer: Self-pay | Admitting: Internal Medicine

## 2012-06-13 IMAGING — CR DG CHEST 2V
2 series · 2 of 2 positions shown · non-contrast
Comparison: Two-view chest x-ray 03/28/2009.

CLINICAL DATA: Cough.  History of hypertension.

CHEST - 2 VIEW 04/12/2011:

[view not recorded (1 of 2)]
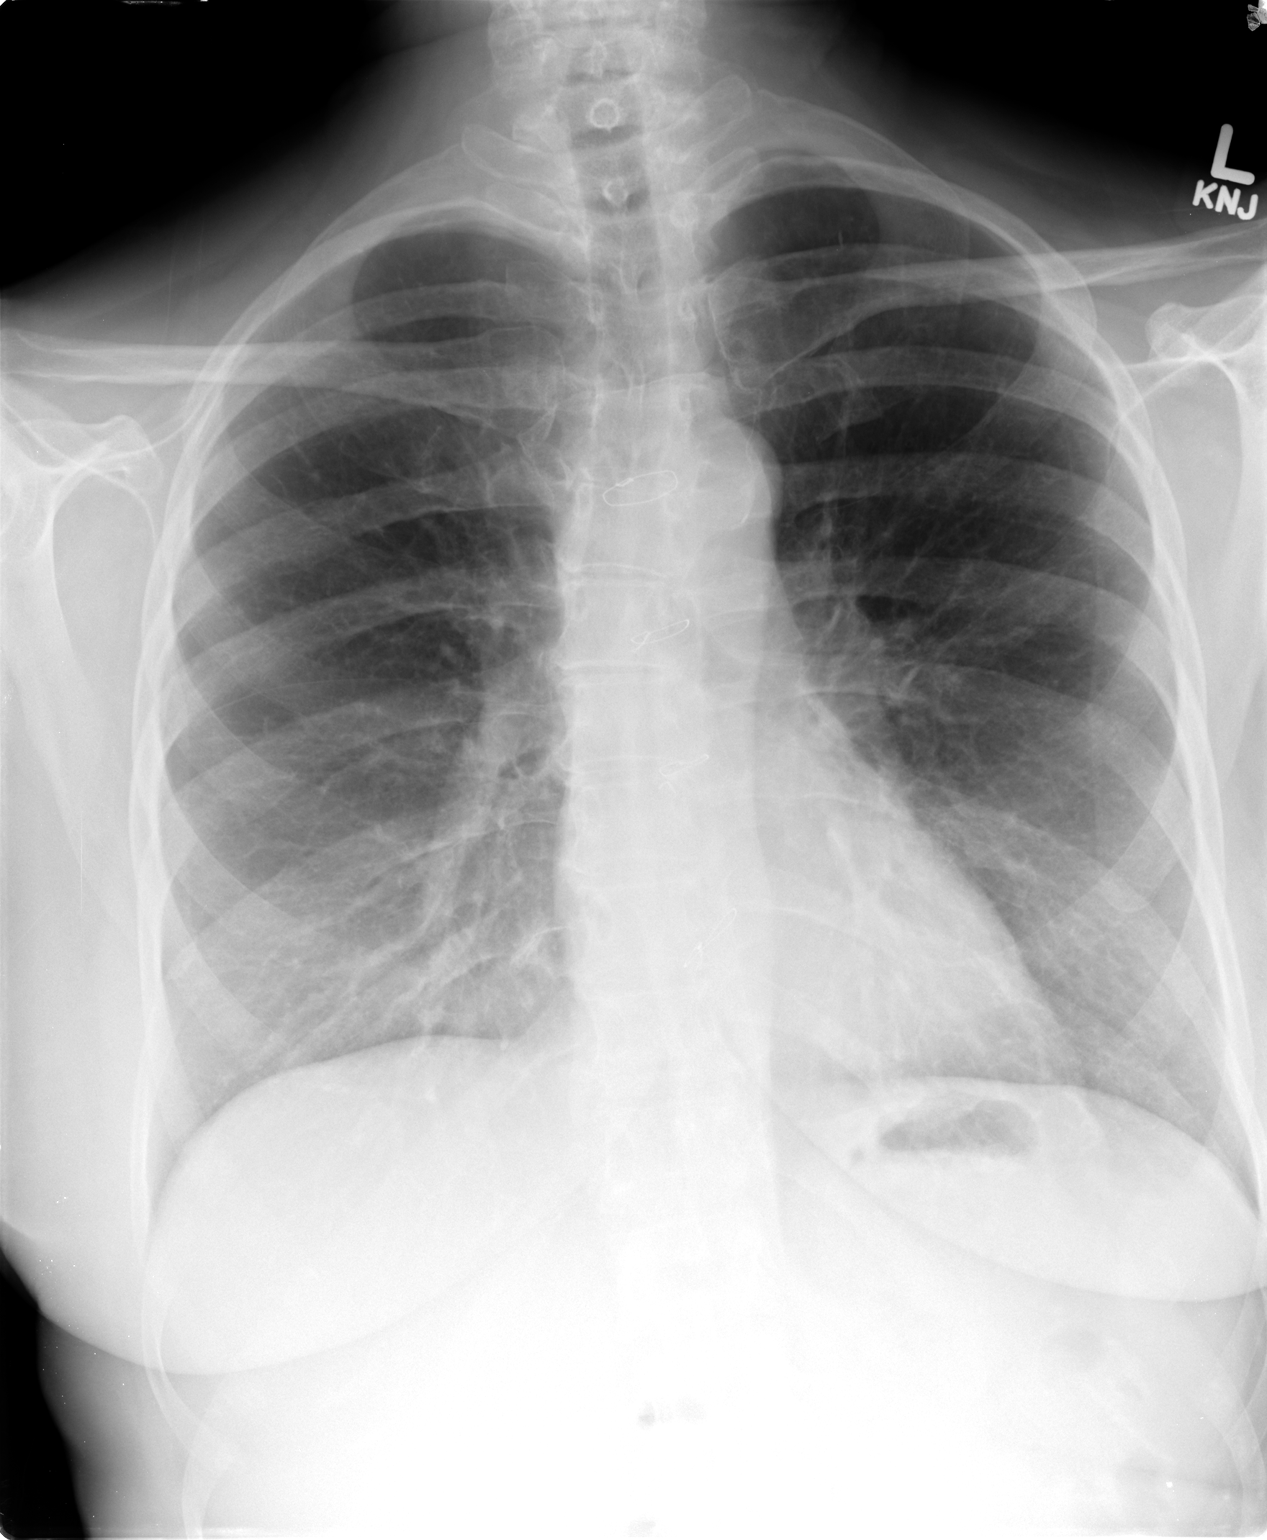

[view not recorded (2 of 2)]
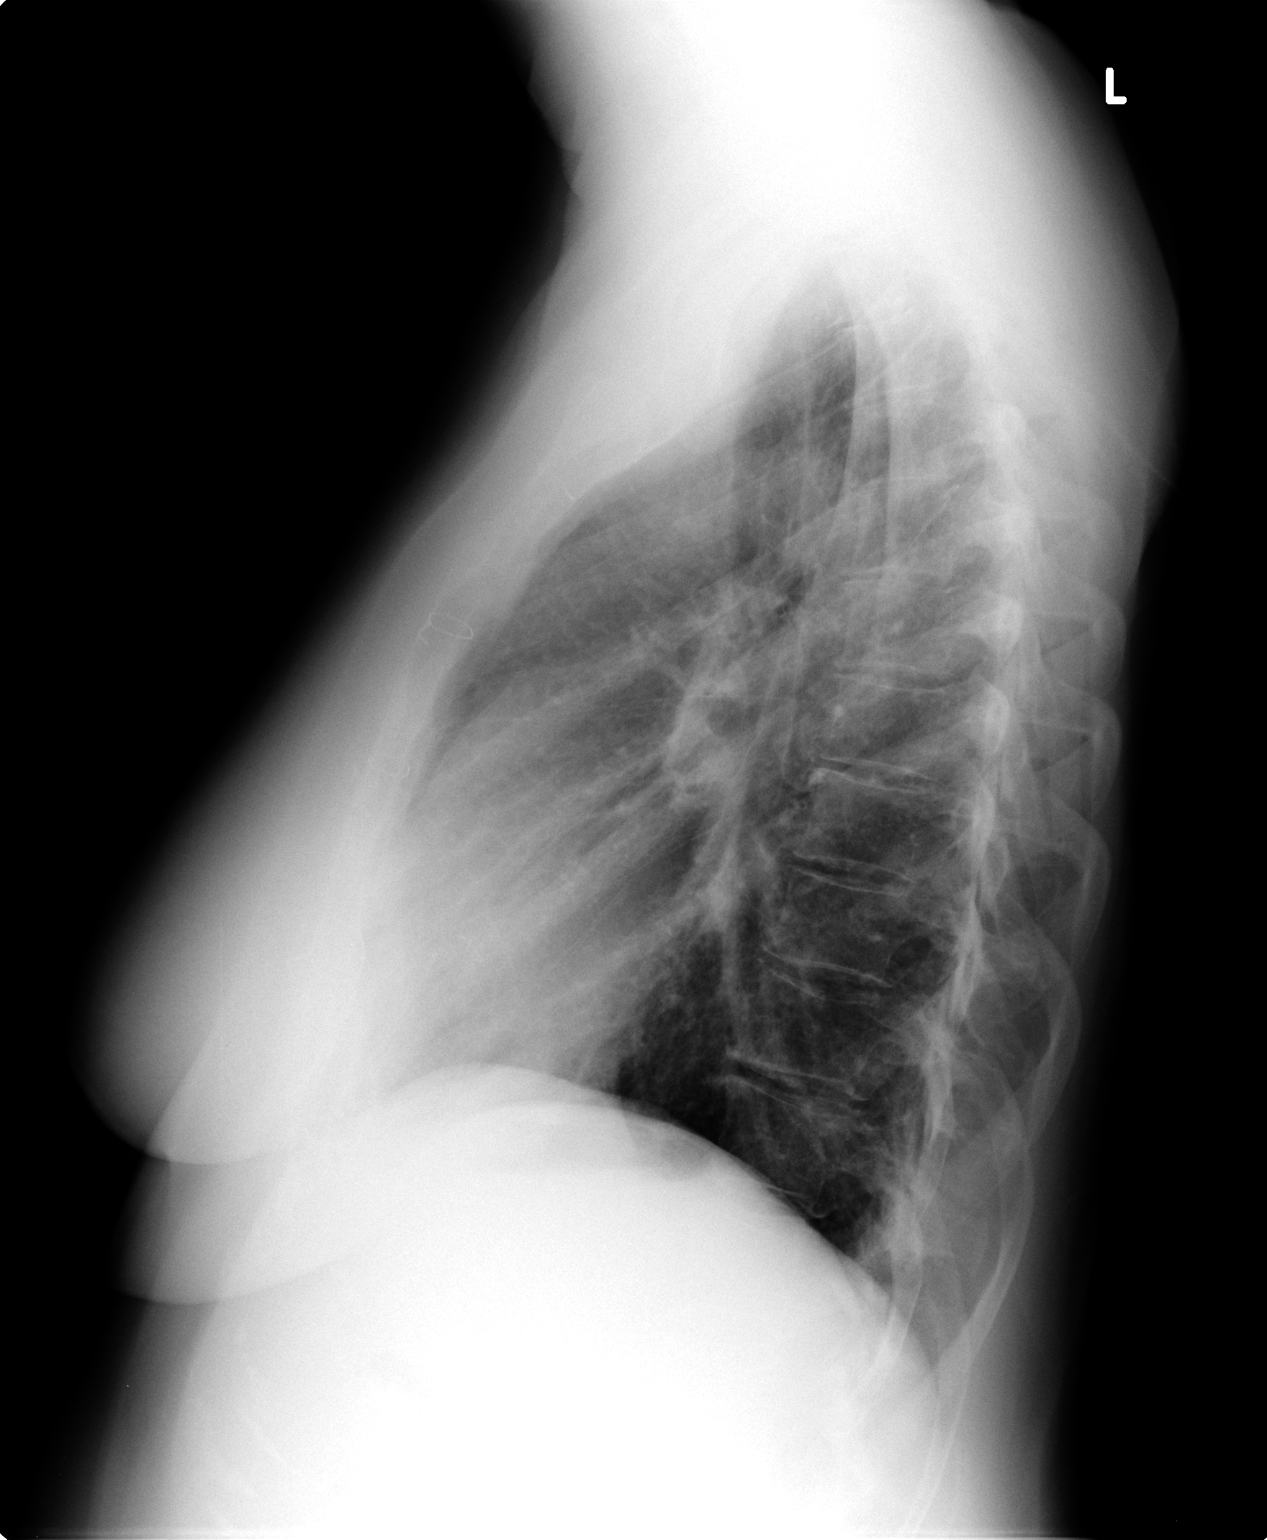

[2 of 2 positions shown; findings below may reference images not displayed]

FINDINGS: Prior sternotomy.  Cardiac silhouette normal in size,
unchanged.  Thoracic aorta mildly tortuous and atherosclerotic,
unchanged.  Hilar and mediastinal contours otherwise unremarkable.
Lungs clear.  No pleural effusions.  Mild degenerative changes
involving the thoracic spine.  Slight upper thoracic scoliosis
convex left and compensatory lower thoracic scoliosis convex right.
IMPRESSION: No acute cardiopulmonary disease.  Stable chest x-ray.

## 2012-08-06 ENCOUNTER — Other Ambulatory Visit: Payer: Self-pay | Admitting: Internal Medicine

## 2012-08-06 DIAGNOSIS — Z1231 Encounter for screening mammogram for malignant neoplasm of breast: Secondary | ICD-10-CM

## 2012-08-25 ENCOUNTER — Ambulatory Visit (HOSPITAL_COMMUNITY)
Admission: RE | Admit: 2012-08-25 | Discharge: 2012-08-25 | Disposition: A | Discharge status: Home / Self Care | Payer: Medicare HMO | Source: Ambulatory Visit | Attending: Internal Medicine | Admitting: Internal Medicine

## 2012-08-25 DIAGNOSIS — Z1231 Encounter for screening mammogram for malignant neoplasm of breast: Secondary | ICD-10-CM | POA: Insufficient documentation

## 2013-04-19 ENCOUNTER — Ambulatory Visit (INDEPENDENT_AMBULATORY_CARE_PROVIDER_SITE_OTHER): Payer: Medicare HMO

## 2013-04-19 DIAGNOSIS — E785 Hyperlipidemia, unspecified: Secondary | ICD-10-CM

## 2013-04-19 DIAGNOSIS — I10 Essential (primary) hypertension: Secondary | ICD-10-CM

## 2013-04-19 DIAGNOSIS — Z0000 Encounter for general adult medical examination without abnormal findings: Secondary | ICD-10-CM

## 2013-04-19 DIAGNOSIS — R7309 Other abnormal glucose: Secondary | ICD-10-CM

## 2013-04-19 LAB — HEPATIC FUNCTION PANEL
ALT: 24 U/L (ref 0–35)
Albumin: 4.1 g/dL (ref 3.5–5.2)
Total Bilirubin: 0.8 mg/dL (ref 0.3–1.2)
Total Protein: 7 g/dL (ref 6.0–8.3)

## 2013-04-19 LAB — URINALYSIS, ROUTINE W REFLEX MICROSCOPIC
Bilirubin Urine: NEGATIVE
Leukocytes, UA: NEGATIVE
Nitrite: NEGATIVE

## 2013-04-19 LAB — BASIC METABOLIC PANEL
GFR: 98.31 mL/min (ref >60.00)
Potassium: 3.7 mEq/L (ref 3.5–5.1)
Sodium: 140 mEq/L (ref 135–145)

## 2013-04-19 LAB — LIPID PANEL
Cholesterol: 220 mg/dL — ABNORMAL HIGH (ref 0–200)
HDL: 44.4 mg/dL (ref >39.00)
Triglycerides: 154 mg/dL — ABNORMAL HIGH (ref 0.0–149.0)
VLDL: 30.8 mg/dL (ref 0.0–40.0)

## 2013-04-19 LAB — CBC WITH DIFFERENTIAL/PLATELET
Basophils Relative: 1.1 % (ref 0.0–3.0)
Eosinophils Relative: 3.6 % (ref 0.0–5.0)
HCT: 43.5 % (ref 36.0–46.0)
Lymphs Abs: 1.2 10*3/uL (ref 0.7–4.0)
MCV: 91.4 fl (ref 78.0–100.0)
Monocytes Absolute: 0.5 10*3/uL (ref 0.1–1.0)
Neutro Abs: 3.2 10*3/uL (ref 1.4–7.7)
Platelets: 267 10*3/uL (ref 150.0–400.0)
WBC: 5.2 10*3/uL (ref 4.5–10.5)

## 2013-04-23 ENCOUNTER — Ambulatory Visit (INDEPENDENT_AMBULATORY_CARE_PROVIDER_SITE_OTHER): Payer: Medicare HMO | Admitting: Internal Medicine

## 2013-04-23 ENCOUNTER — Encounter: Payer: Self-pay | Admitting: Internal Medicine

## 2013-04-23 VITALS — BP 130/80 | HR 52 | Temp 97.7°F | Ht 68.0 in | Wt 177.0 lb

## 2013-04-23 DIAGNOSIS — E785 Hyperlipidemia, unspecified: Secondary | ICD-10-CM

## 2013-04-23 DIAGNOSIS — Z0000 Encounter for general adult medical examination without abnormal findings: Secondary | ICD-10-CM

## 2013-04-23 DIAGNOSIS — I10 Essential (primary) hypertension: Secondary | ICD-10-CM

## 2013-04-23 DIAGNOSIS — R7302 Impaired glucose tolerance (oral): Secondary | ICD-10-CM

## 2013-04-23 DIAGNOSIS — R7309 Other abnormal glucose: Secondary | ICD-10-CM

## 2013-04-23 HISTORY — DX: Impaired glucose tolerance (oral): R73.02

## 2013-04-23 MED ORDER — LOSARTAN POTASSIUM-HCTZ 50-12.5 MG PO TABS: 1.0000 | ORAL_TABLET | Freq: Every day | ORAL | Status: DC

## 2013-04-23 MED ORDER — LOSARTAN POTASSIUM-HCTZ 50-12.5 MG PO TABS: 1.0000 | ORAL_TABLET | Freq: Every day | ORAL | Status: AC

## 2013-04-23 MED ORDER — ATORVASTATIN CALCIUM 20 MG PO TABS: 20.0000 mg | ORAL_TABLET | Freq: Every day | ORAL | Status: DC

## 2013-04-23 NOTE — Progress Notes (Signed)
Subjective:    Patient ID: Annette Schmitt, female    DOB: 25-Aug-1946, 67 y.o.   MRN: [id number]  HPI  Here for wellness and f/u;  Overall doing ok;  Pt denies CP, worsening SOB, DOE, wheezing, orthopnea, PND, worsening LE edema, palpitations, dizziness or syncope.  Pt denies neurological change such as new headache, facial or extremity weakness.  Pt denies polydipsia, polyuria, or low sugar symptoms. Pt states overall good compliance with treatment and medications, good tolerability, and has been trying to follow lower cholesterol diet.  Pt denies worsening depressive symptoms, suicidal ideation or panic. No fever, night sweats, wt loss, loss of appetite, or other constitutional symptoms.  Pt states good ability with ADL's, has low fall risk, home safety reviewed and adequate, no other significant changes in hearing or vision, and only occasionally active with exercise.   Pt denies polydipsia, polyuria. Past Medical History  Diagnosis Date  . ALLERGIC RHINITIS 03/28/2009  . Cough 03/28/2009  . HYPERTENSION 03/28/2009  . OVERACTIVE BLADDER 03/28/2009  . Impaired glucose tolerance 04/23/2013   Past Surgical History  Procedure Laterality Date  . S/p breast biopsy       x2 1970's benign    reports that she has never smoked. She does not have any smokeless tobacco history on file. She reports that  drinks alcohol. She reports that she does not use illicit drugs. family history includes Arthritis in her mother; Diabetes in her father; Heart disease in her father; and Hypertension in her mother. No Known Allergies Current Outpatient Prescriptions on File Prior to Visit  Medication Sig Dispense Refill  . aspirin 81 MG EC tablet Take 81 mg by mouth daily.         No current facility-administered medications on file prior to visit.   Review of Systems Constitutional: Negative for diaphoresis, activity change, appetite change or unexpected weight change.  HENT: Negative for hearing loss, ear pain, facial  swelling, mouth sores and neck stiffness.   Eyes: Negative for pain, redness and visual disturbance.  Respiratory: Negative for shortness of breath and wheezing.   Cardiovascular: Negative for chest pain and palpitations.  Gastrointestinal: Negative for diarrhea, blood in stool, abdominal distention or other pain Genitourinary: Negative for hematuria, flank pain or change in urine volume.  Musculoskeletal: Negative for myalgias and joint swelling.  Skin: Negative for color change and wound.  Neurological: Negative for syncope and numbness. other than noted Hematological: Negative for adenopathy.  Psychiatric/Behavioral: Negative for hallucinations, self-injury, decreased concentration and agitation.      Objective:   Physical Exam BP 130/80  Pulse 52  Temp(Src) 97.7 F (36.5 C) (Oral)  Ht 5\' 8"  (1.727 m)  Wt 177 lb (80.287 kg)  BMI 26.92 kg/m2  SpO2 96% VS noted,  Constitutional: Pt is oriented to person, place, and time. Appears well-developed and well-nourished.  Head: Normocephalic and atraumatic.  Right Ear: External ear normal.  Left Ear: External ear normal.  Nose: Nose normal.  Mouth/Throat: Oropharynx is clear and moist.  Eyes: Conjunctivae and EOM are normal. Pupils are equal, round, and reactive to light.  Neck: Normal range of motion. Neck supple. No JVD present. No tracheal deviation present.  Cardiovascular: Normal rate, regular rhythm, normal heart sounds and intact distal pulses.   Pulmonary/Chest: Effort normal and breath sounds normal.  Abdominal: Soft. Bowel sounds are normal. There is no tenderness. No HSM  Musculoskeletal: Normal range of motion. Exhibits no edema.  Lymphadenopathy:  Has no cervical adenopathy.  Neurological: Pt is  alert and oriented to person, place, and time. Pt has normal reflexes. No cranial nerve deficit.  Skin: Skin is warm and dry. No rash noted.  Psychiatric:  Has  normal mood and affect. Behavior is normal.     Assessment & Plan:

## 2013-04-23 NOTE — Assessment & Plan Note (Signed)
stable overall by history and exam, recent data reviewed with pt, and pt to continue medical treatment as before,  to f/u any worsening symptoms or concerns BP Readings from Last 3 Encounters:  04/23/13 130/80  04/13/12 130/82  04/12/11 118/70

## 2013-04-23 NOTE — Assessment & Plan Note (Signed)
Mild uncontrolled, for lipitor 20, lower chol diet

## 2013-04-23 NOTE — Assessment & Plan Note (Addendum)

## 2013-04-23 NOTE — Patient Instructions (Signed)
Please take all new medication as prescribed - the lipitor Please continue all other medications as before, and refills have been done if requested. Please continue your efforts at being more active, low cholesterol diet, and weight control. You are otherwise up to date with prevention measures today. Please keep your appointments with your specialists as you may have planned  Please remember to sign up for My Chart if you have not done so, as this will be important to you in the future with finding out test results, communicating by private email, and scheduling acute appointments online when needed.  Please return in 1 year for your yearly visit, or sooner if needed, with Lab testing done 3-5 days before

## 2013-04-24 NOTE — Assessment & Plan Note (Signed)
Mild, for wt loss,  to f/u any worsening symptoms or concerns

## 2013-07-23 ENCOUNTER — Encounter: Payer: Self-pay | Admitting: Internal Medicine

## 2013-07-23 ENCOUNTER — Ambulatory Visit (INDEPENDENT_AMBULATORY_CARE_PROVIDER_SITE_OTHER): Payer: Medicare HMO | Admitting: Internal Medicine

## 2013-07-23 VITALS — BP 140/82 | HR 66 | Temp 98.1°F | Ht 68.0 in | Wt 174.0 lb

## 2013-07-23 DIAGNOSIS — T148XXA Other injury of unspecified body region, initial encounter: Secondary | ICD-10-CM

## 2013-07-23 DIAGNOSIS — M62830 Muscle spasm of back: Secondary | ICD-10-CM

## 2013-07-23 DIAGNOSIS — Z23 Encounter for immunization: Secondary | ICD-10-CM

## 2013-07-23 DIAGNOSIS — M5380 Other specified dorsopathies, site unspecified: Secondary | ICD-10-CM

## 2013-07-23 MED ORDER — METHYLPREDNISOLONE ACETATE 80 MG/ML IJ SUSP
80.0000 mg | Freq: Once | INTRAMUSCULAR | Status: AC
  Administered 2013-07-23: 80 mg via INTRAMUSCULAR

## 2013-07-23 MED ORDER — CYCLOBENZAPRINE HCL 10 MG PO TABS: 10.0000 mg | ORAL_TABLET | Freq: Three times a day (TID) | ORAL | Status: AC | PRN

## 2013-07-23 NOTE — Progress Notes (Signed)
HPI: Pt presents today with complaints with right lower back pain that started last Saturday. Pt describes the pain as a grabbing sensation that feels like spasms. Pt tried taking OTC Advil 2 tablets twice a day a few times and her husbands Hydrocodone 5/325mg  1 tablet for the pain. Pt states she got some relief from both but the area still feel sore. Pt states she has the most pain when rotating from side to side and laying down at night. She has tried a heating pad, which helps while using it, but relief does not last long. Pt cannot recall any incidence of injury.   Past Medical History  Diagnosis Date  . ALLERGIC RHINITIS 03/28/2009  . Cough 03/28/2009  . HYPERTENSION 03/28/2009  . OVERACTIVE BLADDER 03/28/2009  . Impaired glucose tolerance 04/23/2013    Current Outpatient Prescriptions  Medication Sig Dispense Refill  . aspirin 81 MG EC tablet Take 81 mg by mouth daily.        Marland Kitchen atorvastatin (LIPITOR) 20 MG tablet Take 1 tablet (20 mg total) by mouth daily.  90 tablet  3  . losartan-hydrochlorothiazide (HYZAAR) 50-12.5 MG per tablet Take 1 tablet by mouth daily.  90 tablet  3  . cyclobenzaprine (FLEXERIL) 10 MG tablet Take 1 tablet (10 mg total) by mouth 3 (three) times daily as needed for muscle spasms.  30 tablet  0   No current facility-administered medications for this visit.    No Known Allergies   ROS:  Constitutional: Denies fever, malaise, fatigue, headache or abrupt weight changes.  HEENT: Denies eye pain, eye redness, ear pain, ringing in the ears, wax buildup, runny nose, nasal congestion, bloody nose, or sore throat. Respiratory: Denies difficulty breathing, shortness of breath, cough or sputum production.   Cardiovascular: Denies chest pain, chest tightness, palpitations or swelling in the hands or feet.  Gastrointestinal: Denies abdominal pain, bloating, constipation, diarrhea or blood in the stool.  GU: Denies frequency, urgency, pain with urination, blood in urine, odor or  discharge. Musculoskeletal: Endorses lower right back pain and decrease range of motion with lateral back rotation. Denies difficulty with gait, joint pain and swelling.  Skin: Denies redness, rashes, lesions or ulcercations.  Neurological: Denies dizziness, difficulty with memory, difficulty with speech or problems with balance and coordination.   No other specific complaints in a complete review of systems (except as listed in HPI above).  PE:  BP 140/82  Pulse 66  Temp(Src) 98.1 F (36.7 C) (Oral)  Ht 5\' 8"  (1.727 m)  Wt 174 lb (78.926 kg)  BMI 26.46 kg/m2  SpO2 98% Wt Readings from Last 3 Encounters:  07/23/13 174 lb (78.926 kg)  04/23/13 177 lb (80.287 kg)  04/13/12 178 lb 8 oz (80.967 kg)    General: Appears their stated age, well developed, well nourished in NAD. HEENT: Head: normal shape and size; Eyes: sclera white, no icterus, conjunctiva pink, PERRLA and EOMs intact; Ears: Tm's gray and intact, normal light reflex; Nose: mucosa pink and moist, septum midline; Throat/Mouth: Teeth present, mucosa pink and moist, no lesions or ulcerations noted.  Neck: Normal range of motion. Neck supple, trachea midline. No massses, lumps or thyromegaly present.  Cardiovascular: Normal rate and rhythm. S1,S2 noted.  No murmur, rubs or gallops noted. No JVD or BLE edema. No carotid bruits noted. Pulmonary/Chest: Normal effort and positive vesicular breath sounds. No respiratory distress. No wheezes, rales or ronchi noted.  Abdomen: Soft and nontender. Normal bowel sounds, no bruits noted. No distention or masses  noted. Liver, spleen and kidneys non palpable. Musculoskeletal: Normal range of motion. No signs of joint swelling. No difficulty with gait.  Neurological: Alert and oriented. Cranial nerves II-XII intact. Coordination normal. +DTRs bilaterally. Psychiatric: Mood and affect normal. Behavior is normal. Judgment and thought content normal.     Assessment and Plan: Back  pain: Prescribed Flexeril 10mg  1 tablet by mouth at night for pain Continue Advil 400mg -600mg  (2-3 tablets) every 6 hours as needed for pain Stop taking husband Hydrocodone 5/325mg  pain killers Depo 80mg  IM injection given today for inflammation Trying stretching exercises provided today  Follow up on Monday if symptoms do not improve  Vere Diantonio S, Student-NP

## 2013-07-23 NOTE — Patient Instructions (Signed)
Muscle Strain  Muscle strain occurs when a muscle is stretched beyond its normal length. A small number of muscle fibers generally are torn. This is especially common in athletes. This happens when a sudden, violent force placed on a muscle stretches it too far. Usually, recovery from muscle strain takes 1 to 2 weeks. Complete healing will take 5 to 6 weeks.   HOME CARE INSTRUCTIONS    While awake, apply ice to the sore muscle for the first 2 days after the injury.   Put ice in a plastic bag.   Place a towel between your skin and the bag.   Leave the ice on for 15-20 minutes each hour.   Do not use the strained muscle for several days, until you no longer have pain.   You may wrap the injured area with an elastic bandage for comfort. Be careful not to wrap it too tightly. This may interfere with blood circulation or increase swelling.   Only take over-the-counter or prescription medicines for pain, discomfort, or fever as directed by your caregiver.  SEEK MEDICAL CARE IF:   You have increasing pain or swelling in the injured area.  MAKE SURE YOU:    Understand these instructions.   Will watch your condition.   Will get help right away if you are not doing well or get worse.  Document Released: 10/14/2005 Document Revised: 01/06/2012 Document Reviewed: 10/26/2011  [employer] Patient Information 2014 [employer]

## 2013-07-23 NOTE — Progress Notes (Signed)
Subjective:    Patient ID: Annette Schmitt, female    DOB: 10-07-1946, 67 y.o.   MRN: [id number]  HPI  Pt presents to the clinic today with c/o low back pain that started 1 week ago. She denies any specific trauma or lifting injury. She reports that it feels like muscle spasms. She has been taking advil and her husbands Norco, which has helped. The spasms seem to be getting worse. She denies shooting pain down her leg. She denies loss of bowel or bladder.  Review of Systems      Past Medical History  Diagnosis Date  . ALLERGIC RHINITIS 03/28/2009  . Cough 03/28/2009  . HYPERTENSION 03/28/2009  . OVERACTIVE BLADDER 03/28/2009  . Impaired glucose tolerance 04/23/2013    Current Outpatient Prescriptions  Medication Sig Dispense Refill  . aspirin 81 MG EC tablet Take 81 mg by mouth daily.        Marland Kitchen atorvastatin (LIPITOR) 20 MG tablet Take 1 tablet (20 mg total) by mouth daily.  90 tablet  3  . losartan-hydrochlorothiazide (HYZAAR) 50-12.5 MG per tablet Take 1 tablet by mouth daily.  90 tablet  3  . cyclobenzaprine (FLEXERIL) 10 MG tablet Take 1 tablet (10 mg total) by mouth 3 (three) times daily as needed for muscle spasms.  30 tablet  0   No current facility-administered medications for this visit.    No Known Allergies  Family History  Problem Relation Age of Onset  . Hypertension Mother   . Arthritis Mother     RA  . Heart disease Father     CABG, defibrillator  . Diabetes Father     History   Social History  . Marital Status: Married    Spouse Name: N/A    Number of Children: 2  . Years of Education: N/A   Occupational History  . work part time care giver for disabled    Social History Main Topics  . Smoking status: Never Smoker   . Smokeless tobacco: Not on file  . Alcohol Use: Yes     Comment: social  . Drug Use: No  . Sexual Activity: Not on file   Other Topics Concern  . Not on file   Social History Narrative  . No narrative on file      Constitutional: Denies fever, malaise, fatigue, headache or abrupt weight changes.  GU: Denies urgency, frequency, pain with urination, burning sensation, blood in urine, odor or discharge. Musculoskeletal: Pt reports muscle spasms of low back. Denies decrease in range of motion, difficulty with gait, or joint pain and swelling. .  Neurological: Denies dizziness, difficulty with memory, difficulty with speech or problems with balance and coordination.   No other specific complaints in a complete review of systems (except as listed in HPI above).  Objective:   Physical Exam   BP 140/82  Pulse 66  Temp(Src) 98.1 F (36.7 C) (Oral)  Ht 5\' 8"  (1.727 m)  Wt 174 lb (78.926 kg)  BMI 26.46 kg/m2  SpO2 98% Wt Readings from Last 3 Encounters:  07/23/13 174 lb (78.926 kg)  04/23/13 177 lb (80.287 kg)  04/13/12 178 lb 8 oz (80.967 kg)    General: Appears her stated age, well developed, well nourished in NAD. Cardiovascular: Normal rate and rhythm. S1,S2 noted.  No murmur, rubs or gallops noted. No JVD or BLE edema. No carotid bruits noted. Pulmonary/Chest: Normal effort and positive vesicular breath sounds. No respiratory distress. No wheezes, rales or ronchi noted.  Abdomen:  Soft and nontender. Normal bowel sounds, no bruits noted. No distention or masses noted. Liver, spleen and kidneys non palpable. Musculoskeletal: Normal range of motion. No signs of joint swelling. No difficulty with gait. Tender to palpation in the right lower back. Pain worse with rotation.   BMET    Component Value Date/Time   NA 140 04/19/2013 0831   K 3.7 04/19/2013 0831   CL 106 04/19/2013 0831   CO2 26 04/19/2013 0831   GLUCOSE 118* 04/19/2013 0831   BUN 19 04/19/2013 0831   CREATININE 0.6 04/19/2013 0831   CALCIUM 9.2 04/19/2013 0831   GFRNONAA 107.03 12/04/2009 0832    Lipid Panel     Component Value Date/Time   CHOL 220* 04/19/2013 0831   TRIG 154.0* 04/19/2013 0831   HDL 44.40 04/19/2013 0831    CHOLHDL 5 04/19/2013 0831   VLDL 30.8 04/19/2013 0831   LDLCALC 122* 04/10/2012 0815    CBC    Component Value Date/Time   WBC 5.2 04/19/2013 0831   RBC 4.76 04/19/2013 0831   HGB 14.9 04/19/2013 0831   HCT 43.5 04/19/2013 0831   PLT 267.0 04/19/2013 0831   MCV 91.4 04/19/2013 0831   MCHC 34.4 04/19/2013 0831   RDW 12.4 04/19/2013 0831   LYMPHSABS 1.2 04/19/2013 0831   MONOABS 0.5 04/19/2013 0831   EOSABS 0.2 04/19/2013 0831   BASOSABS 0.1 04/19/2013 0831    Hgb A1C Lab Results  Component Value Date   HGBA1C 5.5 04/19/2013        Assessment & Plan:   Muscle strain of right lower back, new onset:  80 mg Depo IM today Continue Advil 800 mg TID prn eRx for Flexeril 10 mg TID prn Counseled pt on not taking her husbands narcotics Perform stretcing exercises as indicated on handout No heavy lifting over the weeked  RTC as needed or if back pain/spasm persist or worsens

## 2013-08-09 ENCOUNTER — Other Ambulatory Visit: Payer: Self-pay | Admitting: Internal Medicine

## 2013-08-09 DIAGNOSIS — Z1231 Encounter for screening mammogram for malignant neoplasm of breast: Secondary | ICD-10-CM

## 2013-08-30 ENCOUNTER — Ambulatory Visit (HOSPITAL_COMMUNITY)
Admission: RE | Admit: 2013-08-30 | Discharge: 2013-08-30 | Disposition: A | Discharge status: Home / Self Care | Payer: Medicare HMO | Source: Ambulatory Visit | Attending: Internal Medicine | Admitting: Internal Medicine

## 2013-08-30 DIAGNOSIS — Z1231 Encounter for screening mammogram for malignant neoplasm of breast: Secondary | ICD-10-CM | POA: Insufficient documentation

## 2013-10-27 IMAGING — MG MM DIGITAL SCREENING BILAT W/ CAD
4 series · 4 of 4 positions shown · non-contrast
Comparison: 09/11/2007

CLINICAL DATA: Screening.

DIGITAL BILATERAL SCREENING MAMMOGRAM WITH CAD
no

[R CC]
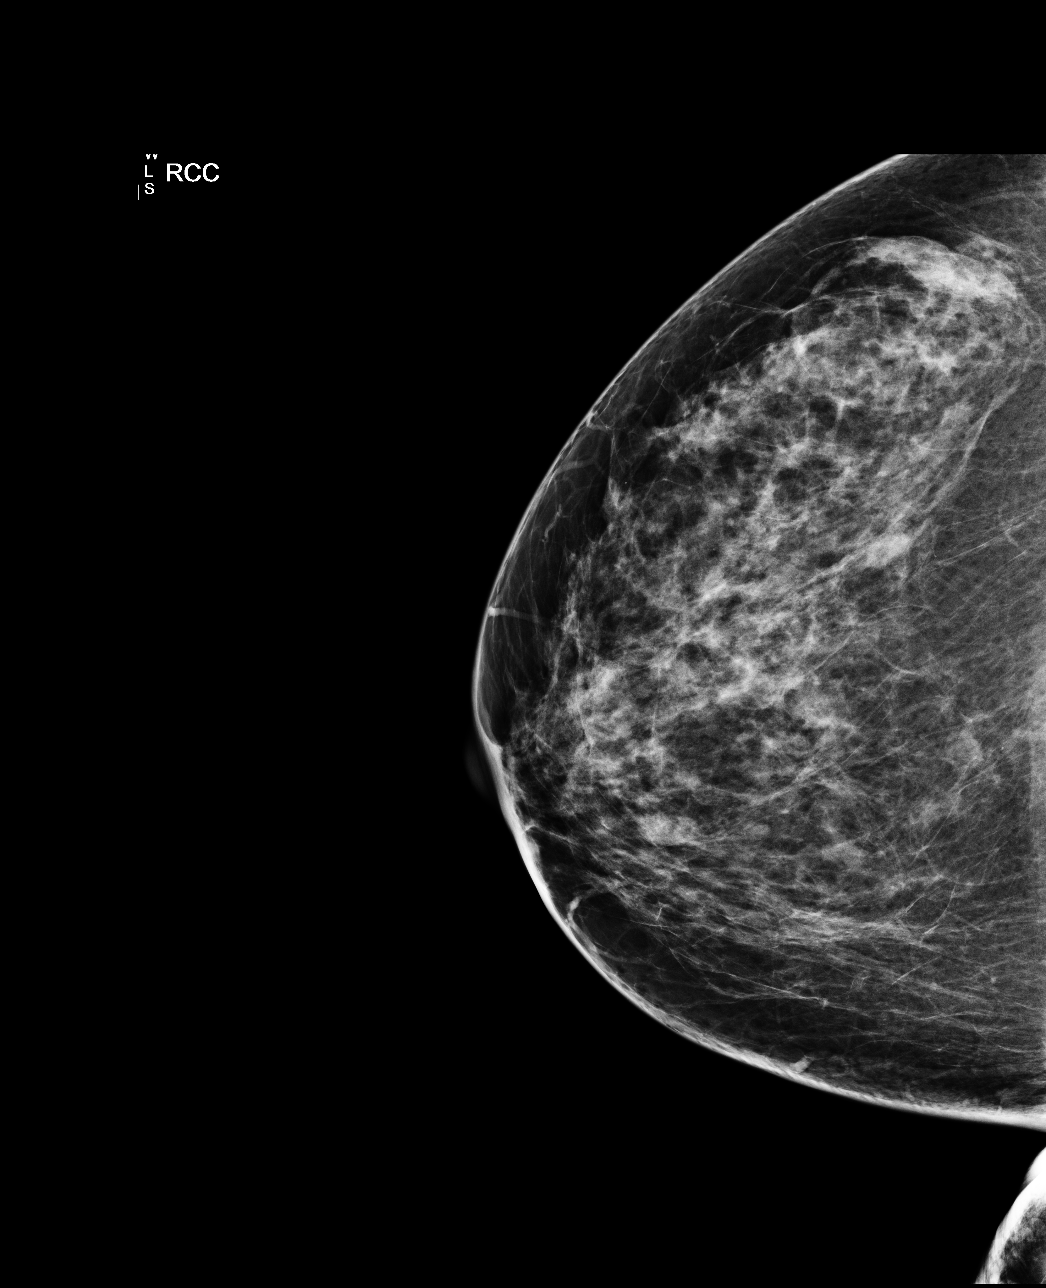

[R MLO]
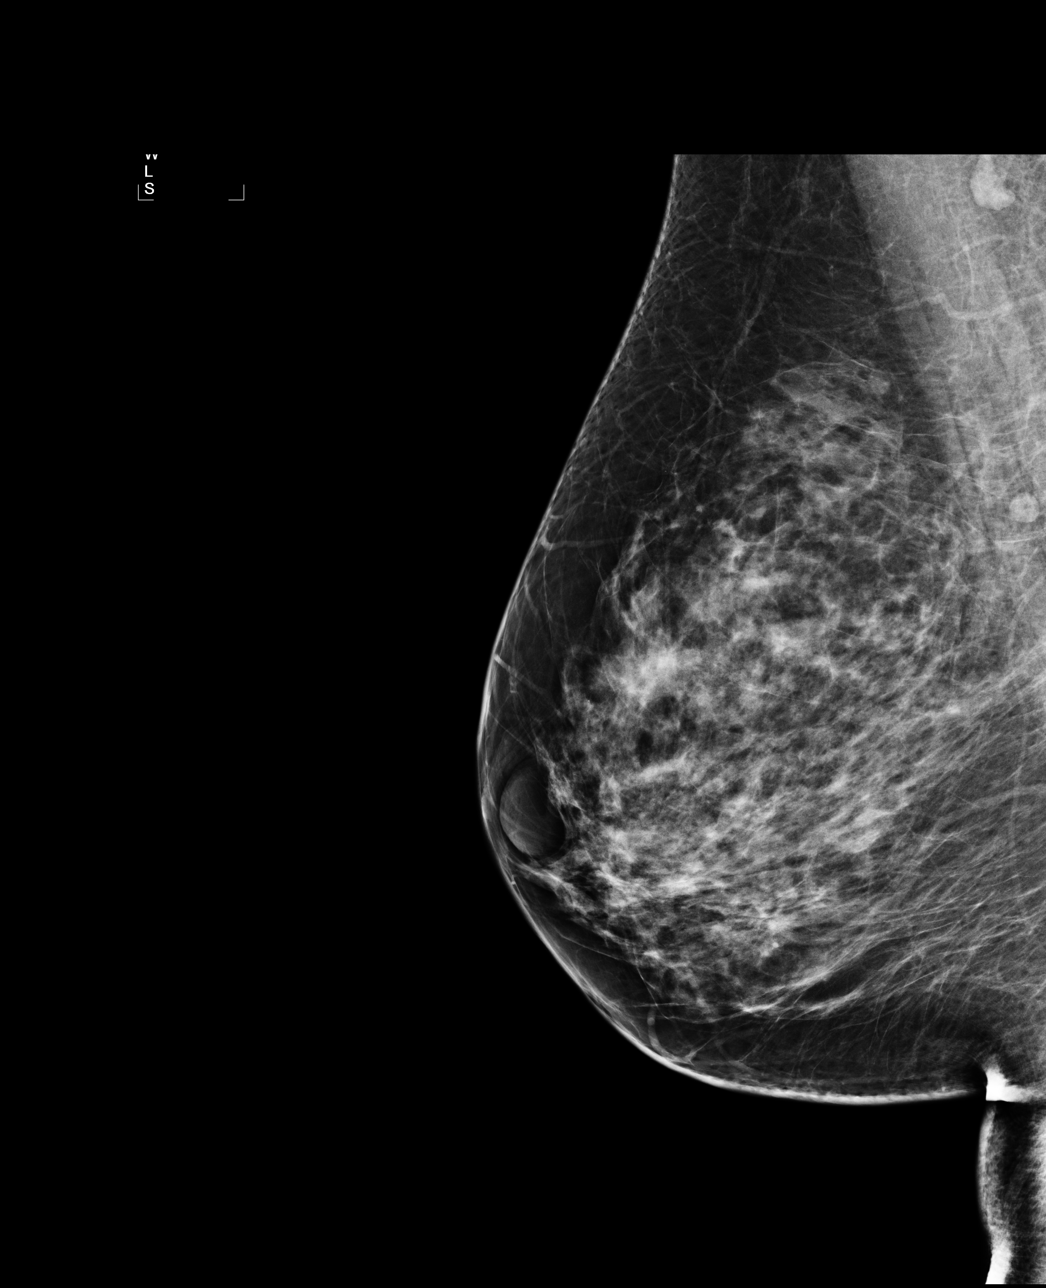

[L CC]
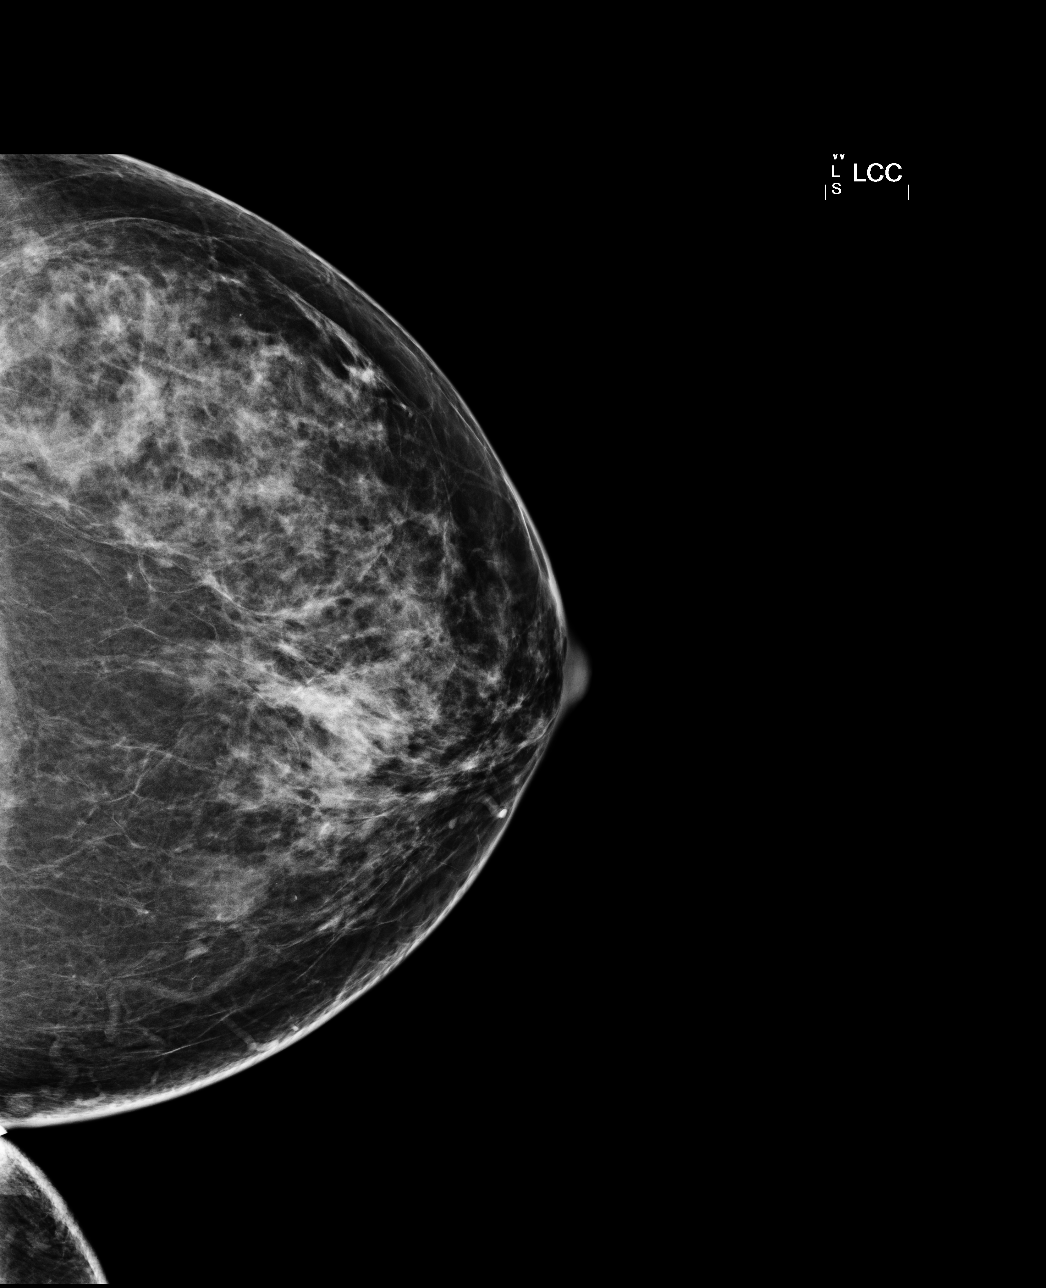

[L MLO]
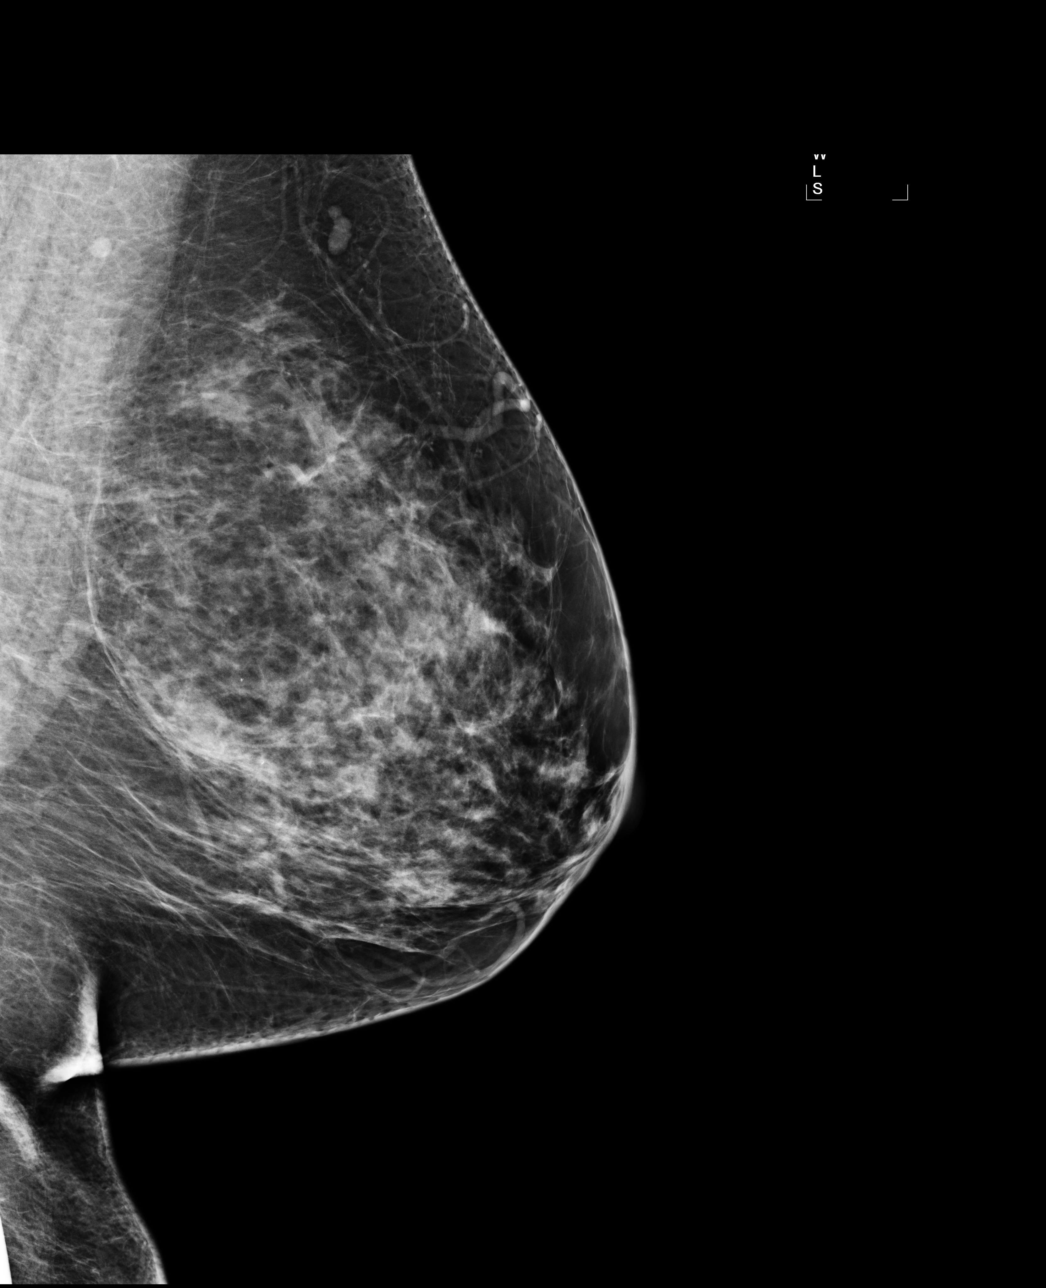

[4 of 4 positions shown; findings below may reference images not displayed]

FINDINGS: The breast tissue is heterogeneously dense.  There is no
suspicious dominant mass, architectural distortion or calcification
to suggest malignancy.

Images were processed with CAD.
IMPRESSION: No mammographic evidence of malignancy.

A result letter of this screening mammogram will be mailed directly
to the patient.

RECOMMENDATION:
Screening mammogram in one year. (Code:2E-Y-2VC)

BI-RADS CATEGORY 1:  Negative

## 2013-11-20 ENCOUNTER — Encounter: Payer: Self-pay | Admitting: Internal Medicine

## 2013-11-20 ENCOUNTER — Ambulatory Visit (INDEPENDENT_AMBULATORY_CARE_PROVIDER_SITE_OTHER): Payer: Medicare HMO | Admitting: Internal Medicine

## 2013-11-20 VITALS — BP 130/80 | HR 67 | Temp 97.6°F | Resp 20 | Ht 68.0 in | Wt 175.0 lb

## 2013-11-20 DIAGNOSIS — N390 Urinary tract infection, site not specified: Secondary | ICD-10-CM | POA: Insufficient documentation

## 2013-11-20 DIAGNOSIS — R300 Dysuria: Secondary | ICD-10-CM

## 2013-11-20 LAB — POCT URINALYSIS DIPSTICK
Bilirubin, UA: NEGATIVE
Glucose, UA: NEGATIVE
Ketones, UA: NEGATIVE
NITRITE UA: NEGATIVE
PH UA: 6
Spec Grav, UA: 1.01
UROBILINOGEN UA: 0.2

## 2013-11-20 MED ORDER — CIPROFLOXACIN HCL 250 MG PO TABS: 250.0000 mg | ORAL_TABLET | Freq: Two times a day (BID) | ORAL | Status: AC

## 2013-11-20 NOTE — Progress Notes (Signed)
Pre-visit discussion using our clinic review tool. No additional management support is needed unless otherwise documented below in the visit note.  

## 2013-11-20 NOTE — Patient Instructions (Signed)
Urinary Tract Infection  Urinary tract infections (UTIs) can develop anywhere along your urinary tract. Your urinary tract is your body's drainage system for removing wastes and extra water. Your urinary tract includes two kidneys, two ureters, a bladder, and a urethra. Your kidneys are a pair of bean-shaped organs. Each kidney is about the size of your fist. They are located below your ribs, one on each side of your spine.  CAUSES  Infections are caused by microbes, which are microscopic organisms, including fungi, viruses, and bacteria. These organisms are so small that they can only be seen through a microscope. Bacteria are the microbes that most commonly cause UTIs.  SYMPTOMS   Symptoms of UTIs may vary by age and gender of the patient and by the location of the infection. Symptoms in young women typically include a frequent and intense urge to urinate and a painful, burning feeling in the bladder or urethra during urination. Older women and men are more likely to be tired, shaky, and weak and have muscle aches and abdominal pain. A fever may mean the infection is in your kidneys. Other symptoms of a kidney infection include pain in your back or sides below the ribs, nausea, and vomiting.  DIAGNOSIS  To diagnose a UTI, your caregiver will ask you about your symptoms. Your caregiver also will ask to provide a urine sample. The urine sample will be tested for bacteria and white blood cells. White blood cells are made by your body to help fight infection.  TREATMENT   Typically, UTIs can be treated with medication. Because most UTIs are caused by a bacterial infection, they usually can be treated with the use of antibiotics. The choice of antibiotic and length of treatment depend on your symptoms and the type of bacteria causing your infection.  HOME CARE INSTRUCTIONS   If you were prescribed antibiotics, take them exactly as your caregiver instructs you. Finish the medication even if you feel better after you  have only taken some of the medication.   Drink enough water and fluids to keep your urine clear or pale yellow.   Avoid caffeine, tea, and carbonated beverages. They tend to irritate your bladder.   Empty your bladder often. Avoid holding urine for long periods of time.   Empty your bladder before and after sexual intercourse.   After a bowel movement, women should cleanse from front to back. Use each tissue only once.  SEEK MEDICAL CARE IF:    You have back pain.   You develop a fever.   Your symptoms do not begin to resolve within 3 days.  SEEK IMMEDIATE MEDICAL CARE IF:    You have severe back pain or lower abdominal pain.   You develop chills.   You have nausea or vomiting.   You have continued burning or discomfort with urination.  MAKE SURE YOU:    Understand these instructions.   Will watch your condition.   Will get help right away if you are not doing well or get worse.  Document Released: 07/24/2005 Document Revised: 04/14/2012 Document Reviewed: 11/22/2011  [employer] Patient Information 2014 [employer]

## 2013-11-20 NOTE — Progress Notes (Signed)
   Subjective:    Patient ID: Annette Schmitt, female    DOB: 10-04-1946, 68 y.o.   MRN: [id number]  Dysuria  This is a new problem. The current episode started in the past 7 days. The problem occurs every urination. The problem has been unchanged. The quality of the pain is described as burning. The pain is at a severity of 1/10. The pain is mild. There has been no fever. The fever has been present for less than 1 day. She is sexually active. There is no history of pyelonephritis. Associated symptoms include urgency. Pertinent negatives include no chills, discharge, flank pain, frequency, hematuria, hesitancy, nausea, possible pregnancy, sweats or vomiting. She has tried home medications for the symptoms. The treatment provided no relief. There is no history of catheterization, kidney stones, recurrent UTIs, a single kidney, urinary stasis or a urological procedure.      Review of Systems  Constitutional: Negative.  Negative for fever, chills, diaphoresis, appetite change and fatigue.  HENT: Negative.   Eyes: Negative.   Respiratory: Negative.  Negative for cough, choking, chest tightness, shortness of breath and stridor.   Cardiovascular: Negative.  Negative for chest pain, palpitations and leg swelling.  Gastrointestinal: Negative.  Negative for nausea, vomiting and abdominal pain.  Endocrine: Negative.   Genitourinary: Positive for dysuria and urgency. Negative for hesitancy, frequency, hematuria and flank pain.  Musculoskeletal: Negative.   Skin: Negative.   Allergic/Immunologic: Negative.   Neurological: Negative.   Hematological: Negative.  Negative for adenopathy. Does not bruise/bleed easily.  Psychiatric/Behavioral: Negative.        Objective:   Physical Exam  Vitals reviewed. Constitutional: She is oriented to person, place, and time. She appears well-developed and well-nourished. No distress.  HENT:  Head: Normocephalic and atraumatic.  Mouth/Throat: Oropharynx is clear  and moist. No oropharyngeal exudate.  Eyes: Conjunctivae are normal. Right eye exhibits no discharge. Left eye exhibits no discharge. No scleral icterus.  Neck: Normal range of motion. Neck supple. No JVD present. No tracheal deviation present. No thyromegaly present.  Cardiovascular: Normal rate, regular rhythm, normal heart sounds and intact distal pulses.  Exam reveals no gallop and no friction rub.   No murmur heard. Pulmonary/Chest: Effort normal and breath sounds normal. No stridor. No respiratory distress. She has no wheezes. She has no rales. She exhibits no tenderness.  Abdominal: Soft. Bowel sounds are normal. She exhibits no distension and no mass. There is no hepatosplenomegaly. There is no tenderness. There is no rebound, no guarding and no CVA tenderness.  Musculoskeletal: Normal range of motion. She exhibits no edema and no tenderness.  Lymphadenopathy:    She has no cervical adenopathy.  Neurological: She is oriented to person, place, and time.  Skin: Skin is warm and dry. No rash noted. She is not diaphoretic. No erythema. No pallor.          Assessment & Plan:

## 2013-11-20 NOTE — Assessment & Plan Note (Signed)
She appears to have a UTI - will treat with cipro

## 2014-01-25 ENCOUNTER — Other Ambulatory Visit: Payer: Self-pay | Admitting: Internal Medicine
